# Patient Record
Sex: Male | Born: 1955 | Race: Black or African American | Hispanic: No | Marital: Married | Smoking: Current every day smoker
Health system: Southern US, Community
[De-identification: ages and names within clinical notes are randomized; demographics above are authoritative.]

## PROBLEM LIST (undated history)

## (undated) DIAGNOSIS — I10 Essential (primary) hypertension: Secondary | ICD-10-CM

## (undated) DIAGNOSIS — L84 Corns and callosities: Secondary | ICD-10-CM

## (undated) DIAGNOSIS — E785 Hyperlipidemia, unspecified: Secondary | ICD-10-CM

## (undated) DIAGNOSIS — K219 Gastro-esophageal reflux disease without esophagitis: Secondary | ICD-10-CM

## (undated) DIAGNOSIS — A6000 Herpesviral infection of urogenital system, unspecified: Secondary | ICD-10-CM

## (undated) DIAGNOSIS — I2510 Atherosclerotic heart disease of native coronary artery without angina pectoris: Secondary | ICD-10-CM

## (undated) DIAGNOSIS — N529 Male erectile dysfunction, unspecified: Secondary | ICD-10-CM

## (undated) HISTORY — DX: Gastro-esophageal reflux disease without esophagitis: K21.9

## (undated) HISTORY — DX: Corns and callosities: L84

## (undated) HISTORY — DX: Essential (primary) hypertension: I10

## (undated) HISTORY — PX: CORONARY ANGIOPLASTY WITH STENT PLACEMENT: SHX49

## (undated) HISTORY — DX: Male erectile dysfunction, unspecified: N52.9

## (undated) HISTORY — DX: Herpesviral infection of urogenital system, unspecified: A60.00

## (undated) HISTORY — DX: Hyperlipidemia, unspecified: E78.5

---

## 2001-08-09 DIAGNOSIS — E785 Hyperlipidemia, unspecified: Secondary | ICD-10-CM

## 2001-08-09 HISTORY — DX: Hyperlipidemia, unspecified: E78.5

## 2003-06-11 ENCOUNTER — Emergency Department (HOSPITAL_COMMUNITY): Admission: AD | Admit: 2003-06-11 | Discharge: 2003-06-11 | Payer: Self-pay | Admitting: Emergency Medicine

## 2004-02-16 ENCOUNTER — Ambulatory Visit (HOSPITAL_COMMUNITY): Admission: RE | Admit: 2004-02-16 | Discharge: 2004-02-16 | Payer: Self-pay | Admitting: Gastroenterology

## 2004-06-24 DIAGNOSIS — A6000 Herpesviral infection of urogenital system, unspecified: Secondary | ICD-10-CM

## 2004-06-24 HISTORY — DX: Herpesviral infection of urogenital system, unspecified: A60.00

## 2008-12-06 ENCOUNTER — Encounter: Payer: Self-pay | Admitting: Emergency Medicine

## 2008-12-06 ENCOUNTER — Ambulatory Visit: Payer: Self-pay | Admitting: Diagnostic Radiology

## 2008-12-07 ENCOUNTER — Ambulatory Visit: Payer: Self-pay | Admitting: Cardiology

## 2008-12-07 ENCOUNTER — Observation Stay (HOSPITAL_COMMUNITY): Admission: EM | Admit: 2008-12-07 | Discharge: 2008-12-09 | Payer: Self-pay | Admitting: Internal Medicine

## 2008-12-08 ENCOUNTER — Encounter: Payer: Self-pay | Admitting: Cardiology

## 2010-07-02 LAB — DIFFERENTIAL
Basophils Absolute: 0.1 10*3/uL (ref 0.0–0.1)
Eosinophils Relative: 4 % (ref 0–5)
Lymphocytes Relative: 33 % (ref 12–46)
Lymphs Abs: 2.6 10*3/uL (ref 0.7–4.0)
Monocytes Absolute: 0.6 10*3/uL (ref 0.1–1.0)
Monocytes Relative: 8 % (ref 3–12)
Neutrophils Relative %: 55 % (ref 43–77)

## 2010-07-02 LAB — HEPATIC FUNCTION PANEL
ALT: 21 U/L (ref 0–53)
ALT: 21 U/L (ref 0–53)
AST: 28 U/L (ref 0–37)
Albumin: 3.7 g/dL (ref 3.5–5.2)
Alkaline Phosphatase: 60 U/L (ref 39–117)
Bilirubin, Direct: <0.1 mg/dL (ref 0.0–0.3)
Total Bilirubin: 0.5 mg/dL (ref 0.3–1.2)
Total Protein: 7 g/dL (ref 6.0–8.3)

## 2010-07-02 LAB — BASIC METABOLIC PANEL
BUN: 10 mg/dL (ref 6–23)
BUN: 9 mg/dL (ref 6–23)
CO2: 25 mEq/L (ref 19–32)
Calcium: 9 mg/dL (ref 8.4–10.5)
Chloride: 102 mEq/L (ref 96–112)
Creatinine, Ser: 0.94 mg/dL (ref 0.4–1.5)
GFR calc Af Amer: >60 mL/min (ref >60)
GFR calc non Af Amer: >60 mL/min (ref >60)
GFR calc non Af Amer: >60 mL/min (ref >60)
Glucose, Bld: 93 mg/dL (ref 70–99)
Potassium: 3.9 mEq/L (ref 3.5–5.1)
Sodium: 138 mEq/L (ref 135–145)

## 2010-07-02 LAB — CBC
HCT: 44.2 % (ref 39.0–52.0)
Hemoglobin: 14.8 g/dL (ref 13.0–17.0)
MCHC: 33.5 g/dL (ref 30.0–36.0)
MCV: 90.1 fL (ref 78.0–100.0)
Platelets: 166 10*3/uL (ref 150–400)
RBC: 4.87 MIL/uL (ref 4.22–5.81)
RBC: 4.92 MIL/uL (ref 4.22–5.81)
RDW: 13.9 % (ref 11.5–15.5)
WBC: 12.4 10*3/uL — ABNORMAL HIGH (ref 4.0–10.5)

## 2010-07-02 LAB — COMPREHENSIVE METABOLIC PANEL
ALT: 12 U/L (ref 0–53)
Alkaline Phosphatase: 67 U/L (ref 39–117)
BUN: 14 mg/dL (ref 6–23)
CO2: 25 mEq/L (ref 19–32)
Calcium: 9.6 mg/dL (ref 8.4–10.5)
GFR calc non Af Amer: >60 mL/min (ref >60)
Glucose, Bld: 113 mg/dL — ABNORMAL HIGH (ref 70–99)
Potassium: 4.5 mEq/L (ref 3.5–5.1)
Sodium: 140 mEq/L (ref 135–145)

## 2010-07-02 LAB — CARDIAC PANEL(CRET KIN+CKTOT+MB+TROPI)
CK, MB: 1.6 ng/mL (ref 0.3–4.0)
CK, MB: 2.6 ng/mL (ref 0.3–4.0)
CK, MB: 2.8 ng/mL (ref 0.3–4.0)
Relative Index: 1.1 (ref 0.0–2.5)
Relative Index: 1.3 (ref 0.0–2.5)
Relative Index: 1.4 (ref 0.0–2.5)
Total CK: 143 U/L (ref 7–232)
Total CK: 164 U/L (ref 7–232)
Troponin I: 0.01 ng/mL (ref 0.00–0.06)
Troponin I: 0.01 ng/mL (ref 0.00–0.06)
Troponin I: 0.01 ng/mL (ref 0.00–0.06)

## 2010-07-02 LAB — AMYLASE: Amylase: 51 U/L (ref 27–131)

## 2010-07-02 LAB — POCT CARDIAC MARKERS
CKMB, poc: 1.4 ng/mL (ref 1.0–8.0)
CKMB, poc: 2.9 ng/mL (ref 1.0–8.0)
Myoglobin, poc: 49.7 ng/mL (ref 12–200)
Myoglobin, poc: 61.8 ng/mL (ref 12–200)
Troponin i, poc: <0.05 ng/mL (ref 0.00–0.09)
Troponin i, poc: <0.05 ng/mL (ref 0.00–0.09)

## 2010-07-02 LAB — PROTIME-INR
INR: 0.9 (ref 0.00–1.49)
Prothrombin Time: 12.5 seconds (ref 11.6–15.2)

## 2010-07-23 DIAGNOSIS — L84 Corns and callosities: Secondary | ICD-10-CM

## 2010-07-23 HISTORY — DX: Corns and callosities: L84

## 2010-08-13 NOTE — Op Note (Signed)
NAME:  Alejandro Parker, Alejandro Parker NO.:  [id number]   MEDICAL RECORD NO.:  [id number]          PATIENT TYPE:  AMB   LOCATION:  ENDO                         FACILITY:  [facility]   PHYSICIAN:  Aydon L. Malon Kindle., M.D.DATE OF BIRTH:  February 11, 1956   DATE OF PROCEDURE:  02/16/2004  DATE OF DISCHARGE:                                 OPERATIVE REPORT   PROCEDURE:  Colonoscopy.   MEDICATIONS:  1.  Fentanyl 75 mcg.  2.  Versed 6 mg IV.   SCOPE:  Olympus pediatric adjustable colonoscope.   INDICATION:  Rectal bleeding.   DESCRIPTION OF PROCEDURE:  The procedure had been explained to the patient  and consent obtained.  The patient in left lateral decubitus position, the  Olympus pediatric adjustable scope was inserted and advanced.  We reached  the cecum without difficulty.  The ileocecal valve and appendiceal orifice  seen.  The scope was withdrawn, and the cecum, ascending colon, hepatic  flexure, transverse colon, descending, and sigmoid colon were seen well.  No  polyps or other lesions were seen.  No diverticulosis was seen.  Rectum, the  patient was seen to have internal hemorrhoids; no other abnormalities were  seen.  The scope was withdrawn.  The patient tolerated the procedure well.   ASSESSMENT:  Rectal bleeding, probably internal hemorrhoids.  578.1.   PLAN:  We will give a hemorrhoid instruction sheet, recommend a high fiber  diet, and will see back as needed.      JLE/MEDQ  D:  02/16/2004  T:  02/16/2004  Job:  [id number]

## 2010-09-09 ENCOUNTER — Telehealth: Payer: Self-pay | Admitting: Pulmonary Disease

## 2010-09-09 NOTE — Telephone Encounter (Signed)
Error

## 2010-09-20 ENCOUNTER — Encounter: Payer: Self-pay | Admitting: Pulmonary Disease

## 2010-09-21 ENCOUNTER — Institutional Professional Consult (permissible substitution): Payer: Self-pay | Admitting: Pulmonary Disease

## 2010-10-15 ENCOUNTER — Ambulatory Visit (INDEPENDENT_AMBULATORY_CARE_PROVIDER_SITE_OTHER): Payer: BC Managed Care – PPO | Admitting: Pulmonary Disease

## 2010-10-15 ENCOUNTER — Encounter: Payer: Self-pay | Admitting: Pulmonary Disease

## 2010-10-15 VITALS — BP 142/78 | HR 52 | Temp 98.1°F | Ht 74.5 in | Wt 256.8 lb

## 2010-10-15 DIAGNOSIS — R0789 Other chest pain: Secondary | ICD-10-CM

## 2010-10-15 DIAGNOSIS — R0600 Dyspnea, unspecified: Secondary | ICD-10-CM

## 2010-10-15 DIAGNOSIS — R0609 Other forms of dyspnea: Secondary | ICD-10-CM

## 2010-10-15 MED ORDER — ALBUTEROL SULFATE HFA 108 (90 BASE) MCG/ACT IN AERS: 2.0000 | INHALATION_SPRAY | Freq: Four times a day (QID) | RESPIRATORY_TRACT | Status: DC | PRN

## 2010-10-15 NOTE — Assessment & Plan Note (Signed)
The pt has a chest pressure that is atypical for both angina and obstructive disease, but I think he needs to be evaluated for both.  He has calcifications in his LAD on chest ct, and is scheduled for cath end of month.  He has a h/o cigar smoking, but not cigs.  I think he needs full pfts to evaluate for airways disease/airtrapping, as well as DLCO.  I also need to see his ct chest, and I have asked him to bring disk to next visit.

## 2010-10-15 NOTE — Patient Instructions (Signed)
Will schedule for breathing tests, and arrange for followup with me on same day to discuss. Please bring disk of your xray to next visit.

## 2010-10-15 NOTE — Progress Notes (Signed)
  Subjective:    Patient ID: Alejandro Parker, male    DOB: [birth year], 55 y.o.   MRN: [id number]  HPI The pt is a 55y/o male who I have been asked to see for atypical chest discomfort.  He has a 10mos h/o chest pressure whenever he starts exertional activities.  It usually gets better whenever he slows down, then he is able to go back to heavier exertional activity without the discomfort recurring.  His symptoms are variable, with on particular pattern on when it occurs.  He is unsure if he is sob during this time.  He describes the sensation as if "someone is pumping up a balloon in my chest".  He had recent stress test 3mos ago per pt that was negative, but is scheduled for cath next week.  He has had a ct chest that is not available for my review, but the report shows no significant pulmonary finding.  The pt denies any significant cough or congestion, but does continue to smoke cigars.   He has no h/o asthma.   Review of Systems  Constitutional: Positive for unexpected weight change. Negative for fever.  HENT: Positive for sneezing. Negative for ear pain, nosebleeds, congestion, sore throat, rhinorrhea, trouble swallowing, dental problem, postnasal drip and sinus pressure.   Eyes: Negative for redness and itching.  Respiratory: Positive for cough and shortness of breath. Negative for chest tightness and wheezing.   Cardiovascular: Positive for chest pain, palpitations and leg swelling.  Gastrointestinal: Negative for nausea and vomiting.  Genitourinary: Negative for dysuria.  Musculoskeletal: Negative for joint swelling.  Skin: Negative for rash.  Neurological: Negative for headaches.  Hematological: Does not bruise/bleed easily.  Psychiatric/Behavioral: Negative for dysphoric mood. The patient is not nervous/anxious.        Objective:   Physical Exam Constitutional:  Well developed, no acute distress  HENT:  Nares patent without discharge  Oropharynx without exudate, palate and uvula  are elongated  Eyes:  Perrla, eomi, no scleral icterus  Neck:  No JVD, no TMG  Cardiovascular:  Normal rate, regular rhythm, no rubs or gallops.  No murmurs        Intact distal pulses  Pulmonary :  Normal breath sounds, no stridor or respiratory distress   No rales, rhonchi, or wheezing  Abdominal:  Soft, nondistended, bowel sounds present.  No tenderness noted.   Musculoskeletal:  1+ lower extremity edema noted.  Lymph Nodes:  No cervical lymphadenopathy noted  Skin:  No cyanosis noted  Neurologic:  Alert, appropriate, moves all 4 extremities without obvious deficit.         Assessment & Plan:

## 2010-10-22 ENCOUNTER — Ambulatory Visit (HOSPITAL_COMMUNITY)
Admission: RE | Admit: 2010-10-22 | Discharge: 2010-10-23 | Disposition: A | Discharge status: Home / Self Care | Payer: BC Managed Care – PPO | Source: Ambulatory Visit | Attending: Cardiology | Admitting: Cardiology

## 2010-10-22 DIAGNOSIS — Z79899 Other long term (current) drug therapy: Secondary | ICD-10-CM | POA: Insufficient documentation

## 2010-10-22 DIAGNOSIS — I10 Essential (primary) hypertension: Secondary | ICD-10-CM | POA: Insufficient documentation

## 2010-10-22 DIAGNOSIS — E785 Hyperlipidemia, unspecified: Secondary | ICD-10-CM | POA: Insufficient documentation

## 2010-10-22 DIAGNOSIS — Z7982 Long term (current) use of aspirin: Secondary | ICD-10-CM | POA: Insufficient documentation

## 2010-10-22 DIAGNOSIS — I2510 Atherosclerotic heart disease of native coronary artery without angina pectoris: Secondary | ICD-10-CM | POA: Insufficient documentation

## 2010-10-22 DIAGNOSIS — F17200 Nicotine dependence, unspecified, uncomplicated: Secondary | ICD-10-CM | POA: Insufficient documentation

## 2010-10-22 DIAGNOSIS — Z01812 Encounter for preprocedural laboratory examination: Secondary | ICD-10-CM | POA: Insufficient documentation

## 2010-10-22 HISTORY — PX: CORONARY ANGIOPLASTY WITH STENT PLACEMENT: SHX49

## 2010-10-22 LAB — POCT ACTIVATED CLOTTING TIME: Activated Clotting Time: 512 seconds

## 2010-10-23 LAB — BASIC METABOLIC PANEL
BUN: 11 mg/dL (ref 6–23)
Creatinine, Ser: 0.93 mg/dL (ref 0.50–1.35)
GFR calc Af Amer: >60 mL/min (ref >60)
GFR calc non Af Amer: >60 mL/min (ref >60)
Potassium: 3.7 mEq/L (ref 3.5–5.1)

## 2010-10-23 LAB — CBC
HCT: 40.6 % (ref 39.0–52.0)
MCHC: 33.7 g/dL (ref 30.0–36.0)
MCV: 86.2 fL (ref 78.0–100.0)
RDW: 13.9 % (ref 11.5–15.5)

## 2010-11-05 ENCOUNTER — Encounter: Payer: Self-pay | Admitting: Pulmonary Disease

## 2010-11-05 ENCOUNTER — Encounter (HOSPITAL_COMMUNITY): Payer: BC Managed Care – PPO

## 2010-11-05 ENCOUNTER — Ambulatory Visit (HOSPITAL_COMMUNITY)
Admission: RE | Admit: 2010-11-05 | Discharge: 2010-11-05 | Disposition: A | Discharge status: Home / Self Care | Payer: BC Managed Care – PPO | Source: Ambulatory Visit | Attending: Pulmonary Disease | Admitting: Pulmonary Disease

## 2010-11-05 ENCOUNTER — Ambulatory Visit (INDEPENDENT_AMBULATORY_CARE_PROVIDER_SITE_OTHER): Payer: BC Managed Care – PPO | Admitting: Pulmonary Disease

## 2010-11-05 VITALS — BP 112/64 | HR 80 | Temp 98.0°F | Ht 74.5 in | Wt 256.0 lb

## 2010-11-05 DIAGNOSIS — R0609 Other forms of dyspnea: Secondary | ICD-10-CM | POA: Insufficient documentation

## 2010-11-05 DIAGNOSIS — R0989 Other specified symptoms and signs involving the circulatory and respiratory systems: Secondary | ICD-10-CM | POA: Insufficient documentation

## 2010-11-05 DIAGNOSIS — R0789 Other chest pain: Secondary | ICD-10-CM

## 2010-11-05 NOTE — Progress Notes (Signed)
  Subjective:    Patient ID: Alejandro Parker, male    DOB: Feb 23, 1956, 55 y.o.   MRN: [id number]  HPI Patient comes in today for followup of his recent CT and PFTs, done as part of a workup for chest tightness and dyspnea.  His CT chest was totally normal, as were his PFTs.  Since the last visit, the patient has underwent cardiac catheterization, and tells me that he had coronary disease and has had a stent placement.  Records are not available.  Since that time, the patient has had resolution of his chest symptoms.   Review of Systems  Constitutional: Negative for fever and unexpected weight change.  HENT: Negative for ear pain, nosebleeds, congestion, sore throat, rhinorrhea, sneezing, trouble swallowing, dental problem, postnasal drip and sinus pressure.   Eyes: Negative for redness and itching.  Respiratory: Negative for cough, chest tightness, shortness of breath and wheezing.   Cardiovascular: Negative for palpitations and leg swelling.  Gastrointestinal: Negative for nausea and vomiting.  Genitourinary: Negative for dysuria.  Musculoskeletal: Negative for joint swelling.  Skin: Negative for rash.  Neurological: Negative for headaches.  Hematological: Does not bruise/bleed easily.  Psychiatric/Behavioral: Negative for dysphoric mood. The patient is not nervous/anxious.        Objective:   Physical Exam Wd male in nad No purulence or discharge noted from nares. Chest clear LE without edema or cyanosis Alert and oriented, moves all 4        Assessment & Plan:

## 2010-11-05 NOTE — Patient Instructions (Signed)
Your ct chest and pfts are normal.  No further f/u required.

## 2010-11-05 NOTE — Assessment & Plan Note (Signed)
The pt has a normal ct chest and normal PFT's.  His chest pressure has improved with stenting of LAD.  I have asked him to work hard on the lifestyle modifications recommended by cards, and to followup with me as needed.

## 2010-11-07 ENCOUNTER — Encounter: Payer: Self-pay | Admitting: Pulmonary Disease

## 2010-11-09 ENCOUNTER — Encounter: Payer: Self-pay | Admitting: Pulmonary Disease

## 2010-11-10 NOTE — Cardiovascular Report (Signed)
NAME:  Alejandro Parker, Alejandro Parker NO.:  [id number]  MEDICAL RECORD NO.:  [id number]  LOCATION:  [facility]                         FACILITY:  [facility]  PHYSICIAN:  Pamella Pert, MD DATE OF BIRTH:  01/01/1956  DATE OF PROCEDURE:  10/22/2010 DATE OF DISCHARGE:                           CARDIAC CATHETERIZATION   PROCEDURES PERFORMED: 1. Left ventriculography. 2. Selective right and left coronary arteriography. 3. Percutaneous transluminal coronary angioplasty and stenting of the     mid left anterior descending with implantation of a 3.5 x 28 mm     PROMUS drug-eluting stent.  INDICATIONS:  Alejandro Parker is a 54-year gentleman who has history of prior tobacco use and again has recently started to smoke, had quit about 3 weeks ago.  He also has history of hypertension and hyperlipidemia.  He was referred to me for evaluation of chest discomfort which was suggestive of angina pectoris.  He has had 2 negative stress tests in the past.  Hence, he is brought to the Cardiac Catheterization Lab to evaluate his coronary anatomy.  HEMODYNAMIC DATA:  The left ventricular pressure was 110/1 with end- diastolic pressure of 23 mmHg.  Aortic pressure was 116/71 with a mean of 89 mmHg.  There was no pressure gradient across the aortic valve.  ANGIOGRAPHIC DATA: 1. Left ventricle.  Left ventricular systolic function was normal with     ejection fraction of 60%.  There was no significant mitral     regurgitation. 2. Right coronary artery.  Right coronary artery is codominant with     circumflex coronary artery.  The proximal segment has a 30%     stenosis.  It bifurcates early into RV branch and the moderate-     sized PD branch.  Otherwise, it is smooth and normal. 3. Left main coronary artery.  Left main coronary artery is large     vessel, it splits smooth and normal. 4. LAD.  LAD is a large caliber vessel giving origin to a large     diagonal-1 and a small diagonal-2.  The ostium of the  LAD has an     eccentric 40-50% stenosis.  At the origin of the diagonal-2, there     is a large septal perforator-2.  At this point, there is a high-     grade 90% long segment stenosis in the LAD.  Mid-to-distal LAD has     a 40-50% focal ring-like stenoses. 5. Circumflex coronary artery.  The circumflex coronary artery is     codominant with right coronary artery.  This is a large caliber     vessel.  It has a small obtuse marginal and a large obtuse marginal-     2.  Distally, it continues as PD branch.  The obtuse marginal-2 has     a diffuse 30-40% stenosis. 6. Ramus intermediate.  Ramus intermedius is a large caliber vessel     which is smooth and normal.  INTERVENTION DATA:  Successful PTCA and stenting of the mid LAD with implantation of a 3.5 x 28 mm PROMUS drug-eluting stent.  This stent was deployed at peak of 12 atmospheric pressure for 40 seconds.  Stenosis was reduced  from 90% to 20% followed by postdilatation with a 3.75 x 20 mm [place] TREK at 18 atmospheric pressure peak.  Overall stenosis reduction was from 90% to 0% with brisk TIMI 3 to TIMI 3 flow maintained at the end of the procedure.  There was about 20% to 30% side branch compromise of a small diagonal which was left alone.  RECOMMENDATIONS:  The patient needs aggressive risk modification, again tobacco use and counseling for cessation of the same is indicated.  He will be discharged in the morning if he remains stable.  A total of 175-180 mL of contrast was utilized for diagnostic and intervention procedure.  TECHNIQUE OF PROCEDURE:  Under sterile precautions using a 6-French right radial access, 6-French TIG #4 catheter was advanced into ascending aorta.  Then, the left ventriculography was performed both in LAO and RAO projection.  Catheter pulled in the ascending aorta.  Left main coronary artery was selectively engaged and angiography was performed.  Then, the right coronary was selectively engaged  and angiography was performed.  Then, the catheter was pulled out of body over a safety J-wire exchange length.  TECHNIQUE OF INTERVENTION:  Using Angiomax and anticoagulation, I utilized IKARI left 3.5 guide catheter to engage left main coronary artery.  Using Stabilizer guidewire, I was able to cross through the LAD and the lesion length was carefully measured.  This was followed by predilatation with a 3 x 20 mm Sprinter Legend balloon at a peak of 12 atmospheric pressure.  The inflations were performed between 10 and 30 seconds.  Having performed this, I stented this with a 3.5 x 28 mm PROMUS Element Plus drug-eluting stent at 12 atmospheric pressure for 40 seconds followed by postdilatation with [place] TREK with a 3.75 x 20 mm balloon keeping the balloon within the stent starts at 14 and 18 atmospheric pressure for 30 and 37 seconds followed by intracoronary nitroglycerin administration angiography was performed.  Excellent results were noted.  The ostial LAD stenosis which appeared to be intermediate was evaluated in multiple views, hence was felt to be insignificant.  Hence, this lesion was left alone.  The patient tolerated the procedure well.  No immediate complications.     Pamella Pert, MD     JRG/MEDQ  D:  10/22/2010  T:  10/22/2010  Job:  [id number]  cc:   Fleet Contras, M.D.  Electronically Signed by Yates Decamp MD on 11/10/2010 06:47:26 PM

## 2010-11-11 NOTE — Discharge Summary (Signed)
NAMEDERRIC, DEALMEIDA NO.:  [id number]  MEDICAL RECORD NO.:  [id number]  LOCATION:  6524                         FACILITY:  [facility]  PHYSICIAN:  Eduardo Osier. Sharyn Lull, M.D. DATE OF BIRTH:  Feb 14, 1956  DATE OF ADMISSION:  10/22/2010 DATE OF DISCHARGE:                              DISCHARGE SUMMARY   ADMITTING DIAGNOSES: 1. Accelerated angina. 2. Hypertension. 3. Hypercholesteremia. 4. Tobacco abuse.  DISCHARGE DIAGNOSES: 1. Accelerated angina, status post left cath and percutaneous     transluminal coronary angioplasty stenting to proximal left     anterior descending. 2. Hypertension. 3. Hypercholesteremia. 4. Tobacco abuse.  DISCHARGE HOME MEDICATIONS: 1. Enteric-coated aspirin 81 mg 1 tablet daily. 2. Brilinta 90 mg 1 tablet twice daily. 3. Losartan 100 mg 1 tablet daily. 4. Benzonatate 100-200 mg 3 times daily as before. 5. Crestor 40 mg 1 tablet daily. 6. Nitrostat 0.4 mg sublingual use as directed.  DIET:  Low-salt, low-cholesterol.  The patient has been advised to stop smoking to which he agrees lifestyle modification have been discussed at length and he is going to change his diet.  The patient is scheduled for phase II cardiac rehab as outpatient.  CONDITION AT DISCHARGE:  Stable.  Follow up with Dr. Jacinto Halim next week.  BRIEF HISTORY AND HOSPITAL COURSE:  Mr. Uvaldo Rising is a 55 year old black male with past medical history significant for hypertension, hypercholesteremia, tobacco abuse, positive family history of coronary artery disease, was admitted as outpatient for elective left cath, possible PTCA stenting.  The patient complained of retrosternal chest pain off and on for last 1 year associated with exertion, localized, lasting few minutes, released with rest.  The patient denies any nausea, vomiting, or diaphoresis.  Denies any palpitation, lightheadedness or syncope.  The patient had approximately 4 months ago stress test which was negative.   Due to typical anginal chest pain, multiple risk factors, the patient subsequently consented for left cath and possible PCI.  MEDICATION AT HOME:  He was on aspirin, Losartan, simvastatin, omeprazole.  FAMILY HISTORY:  Father had MI at young age, died at the age of 27.  He had lung cancer.  He also had stents and pacemaker.  Mother died at age of 54 due to breast cancer.  SOCIAL HISTORY:  He is married.  Drinks 2-3 drinks per week.  Recently, he started smoking again.  PHYSICAL EXAMINATION:  GENERAL:  He is alert, awake, and oriented x3, in no acute distress. HEENT:  Conjunctiva was pink. NECK:  Supple, no JVD. LUNGS:  Clear to auscultation without rhonchi or rales. CARDIOVASCULAR:  S1 and S2 was normal.  There was no murmur or rub. ABDOMEN:  Soft.  Bowel sounds present, nontender. EXTREMITIES:  There is no clubbing, cyanosis or edema.  LABORATORY DATA:  Postprocedure labs today, hemoglobin is 13.7, hematocrit 40.6, white count of 6.7.  Sodium was 140, potassium 3.7, BUN 11, creatinine 0.93.  BRIEF HOSPITAL COURSE:  The patient was admitted and underwent a left cardiac cath with selective left and right coronary angiography and PTCA stenting via right radial approach by Dr. Jacinto Halim.  The patient tolerated procedure well.  There are no complications.  Postprocedure, the patient did  not have any episodes of chest pain.  His right forearm radial cath site is dry with no evidence of hematoma.  The patient denies any tingling or numbness in the arm.  The patient has been ambulating in the room without any problems.  The patient will be discharged home on above medications and will be followed by Dr. Jacinto Halim next week.  The patient will be scheduled for phase II cardiac rehab as outpatient.     Eduardo Osier. Sharyn Lull, M.D.     MNH/MEDQ  D:  10/23/2010  T:  10/23/2010  Job:  [id number]  Electronically Signed by Rinaldo Cloud M.D. on 11/11/2010 08:06:07 PM

## 2010-11-23 ENCOUNTER — Ambulatory Visit (HOSPITAL_COMMUNITY)
Admission: RE | Admit: 2010-11-23 | Discharge: 2010-11-23 | Disposition: A | Discharge status: Home / Self Care | Payer: BC Managed Care – PPO | Source: Ambulatory Visit | Attending: Cardiology | Admitting: Cardiology

## 2010-11-23 DIAGNOSIS — I209 Angina pectoris, unspecified: Secondary | ICD-10-CM | POA: Insufficient documentation

## 2010-11-23 DIAGNOSIS — I2510 Atherosclerotic heart disease of native coronary artery without angina pectoris: Secondary | ICD-10-CM | POA: Insufficient documentation

## 2010-11-23 DIAGNOSIS — Z9861 Coronary angioplasty status: Secondary | ICD-10-CM | POA: Insufficient documentation

## 2010-12-07 NOTE — Cardiovascular Report (Signed)
NAME:  Alejandro Parker, Alejandro Parker NO.:  [id number]  MEDICAL RECORD NO.:  [id number]  LOCATION:  [facility]                         FACILITY:  [facility]  PHYSICIAN:  Pamella Pert, MD DATE OF BIRTH:  [birth year]  DATE OF PROCEDURE:  11/23/2010 DATE OF DISCHARGE:                           CARDIAC CATHETERIZATION   PROCEDURE PERFORMED:  Coronary arteriography including selective left and right coronary therapy.  INDICATIONS:  Alejandro Parker is a 55 year old gentleman with history of known coronary artery disease.  He had undergone angioplasty and stenting to his proximal LAD on October 22, 2010, with implantation of a 3.5 x 28-mm Promus stent in the proximal LAD.  At that time, he was found to have a very large diagonal 1 which had a 70-80% stent-jailed stenosis with brisk flow, but was left alone.  He had been doing well, but had developed exertional angina pectoris.  He underwent a treadmill stress testing, in which he again developed chest pain with significant ST changes.  Given this, he is now brought back to the angiography suite to reevaluate for any mechanical complications for stenting and also to reevaluate the diagonal stenosis if this is the culprit for his angina.  HEMODYNAMIC DATA:  The aortic pressure was 99/54 with a mean of 81 milliseconds.  ANGIOGRAPHIC DATA:  Left ventriculography.  Left ventriculography was not performed.  Left main coronary artery:  Left main coronary artery is large-caliber vessel.  Smooth and normal.  Circumflex coronary artery:  Circumflex coronary artery is codominant with right coronary artery.  It is smooth and normal.  The obtuse marginal branch has a 10-20% mild luminal irregularity.  Ramus intermedius.  Ramus intermedius is a very large-caliber vessel, it is smooth and normal.  LAD.  LAD is a large-caliber vessel giving origin to a large diagonal 1. The previously placed stent in the proximal LAD is widely patent.   The stent-jailed diagonal ostium actually looks better with only maybe a 20% stenosis with brisk flow.  There was no evidence of any edge dissection or spasm in the coronary arteries.  Right coronary artery.  Right coronary artery is codominant with circumflex coronary artery.  Smooth and normal.  IMPRESSION:  I suspect chest pain could be related to coronary spasm. Widely patent stent.  RECOMMENDATIONS:  The patient will be discharged home today.  I will consider utilization of a calcium channel blocker if his chest pain were to recur.  Continued aggressive risk modification is indicated with aggressive reduction in his lipids.  He is on Zocor and Crestor.  I will stop his Zocor and continue Crestor.  At home, he is also on carvedilol at 6.25 mg p.o. b.i.d., which he will continue.  TECHNIQUE OF THE PROCEDURE:  Under usual sterile precautions using a 6- French right radial access, a 5-French TIG4 catheter was advanced into the ascending aorta and then selective right and left coronary arteriography was performed.  The catheter then pulled out of body over an exchange length J-wire.  The patient tolerated the procedure well. Hemostasis was obtained by applying TR band.     Pamella Pert, MD     JRG/MEDQ  D:  11/23/2010  T:  11/23/2010  Job:  [id number]  cc:   Fleet Contras, M.D.  Electronically Signed by Yates Decamp MD on 12/07/2010 11:14:14 AM

## 2011-11-15 ENCOUNTER — Other Ambulatory Visit: Payer: Self-pay | Admitting: Cardiology

## 2012-11-12 ENCOUNTER — Emergency Department (HOSPITAL_COMMUNITY)
Admission: EM | Admit: 2012-11-12 | Discharge: 2012-11-12 | Disposition: A | Discharge status: Home / Self Care | Payer: BC Managed Care – PPO | Attending: Emergency Medicine | Admitting: Emergency Medicine

## 2012-11-12 ENCOUNTER — Encounter (HOSPITAL_COMMUNITY): Payer: Self-pay | Admitting: *Deleted

## 2012-11-12 ENCOUNTER — Emergency Department (HOSPITAL_COMMUNITY): Payer: BC Managed Care – PPO

## 2012-11-12 DIAGNOSIS — R079 Chest pain, unspecified: Secondary | ICD-10-CM | POA: Insufficient documentation

## 2012-11-12 DIAGNOSIS — F17200 Nicotine dependence, unspecified, uncomplicated: Secondary | ICD-10-CM | POA: Insufficient documentation

## 2012-11-12 DIAGNOSIS — Z79899 Other long term (current) drug therapy: Secondary | ICD-10-CM | POA: Insufficient documentation

## 2012-11-12 DIAGNOSIS — I10 Essential (primary) hypertension: Secondary | ICD-10-CM

## 2012-11-12 DIAGNOSIS — Z9861 Coronary angioplasty status: Secondary | ICD-10-CM

## 2012-11-12 DIAGNOSIS — Z7982 Long term (current) use of aspirin: Secondary | ICD-10-CM | POA: Insufficient documentation

## 2012-11-12 DIAGNOSIS — E785 Hyperlipidemia, unspecified: Secondary | ICD-10-CM

## 2012-11-12 DIAGNOSIS — I2510 Atherosclerotic heart disease of native coronary artery without angina pectoris: Secondary | ICD-10-CM

## 2012-11-12 HISTORY — DX: Atherosclerotic heart disease of native coronary artery without angina pectoris: I25.10

## 2012-11-12 LAB — CBC
MCH: 30.1 pg (ref 26.0–34.0)
MCV: 85.4 fL (ref 78.0–100.0)
Platelets: 163 10*3/uL (ref 150–400)
RBC: 4.19 MIL/uL — ABNORMAL LOW (ref 4.22–5.81)
RDW: 15.2 % (ref 11.5–15.5)

## 2012-11-12 LAB — POCT I-STAT TROPONIN I

## 2012-11-12 LAB — BASIC METABOLIC PANEL
Chloride: 106 mEq/L (ref 96–112)
GFR calc Af Amer: >90 mL/min (ref >90)
GFR calc non Af Amer: 80 mL/min — ABNORMAL LOW (ref >90)
Glucose, Bld: 93 mg/dL (ref 70–99)
Potassium: 3.6 mEq/L (ref 3.5–5.1)
Sodium: 140 mEq/L (ref 135–145)

## 2012-11-12 MED ORDER — OMEPRAZOLE 20 MG PO CPDR: 20.0000 mg | DELAYED_RELEASE_CAPSULE | Freq: Every day | ORAL | Status: DC

## 2012-11-12 NOTE — ED Notes (Signed)
Pt states chest pain that started this am at rest and took nitro with some relief.  No shortness of breath or radiation of pain

## 2012-11-12 NOTE — ED Provider Notes (Signed)
CSN: [id number]     Arrival date & time 11/12/12  1334 History     First MD Initiated Contact with Patient 11/12/12 1353     Chief Complaint  Patient presents with  . Chest Pain   (Consider location/radiation/quality/duration/timing/severity/associated sxs/prior Treatment) Patient is a 57 y.o. male presenting with chest pain. The history is provided by the patient.  Chest Pain Associated symptoms: no abdominal pain, no back pain, no headache, no nausea, no numbness, no shortness of breath, not vomiting and no weakness    patient with dull left lower chest pain. Began while at work today. He had not done much physical activity. Usually by nitroglycerin. Has previous coronary artery disease with a stent. No nausea. He states he feels better now. He previously seen Dr. Nadara Eaton. The last time that he used nitroglycerin was a few years ago.  Past Medical History  Diagnosis Date  . Coronary artery disease   . Hypertension    Past Surgical History  Procedure Laterality Date  . Coronary angioplasty with stent placement     No family history on file. History  Substance Use Topics  . Smoking status: Current Every Day Smoker  . Smokeless tobacco: Not on file  . Alcohol Use: Yes     Comment: occ    Review of Systems  Constitutional: Negative for activity change and appetite change.  HENT: Negative for neck stiffness.   Eyes: Negative for pain.  Respiratory: Negative for chest tightness and shortness of breath.   Cardiovascular: Positive for chest pain. Negative for leg swelling.  Gastrointestinal: Negative for nausea, vomiting, abdominal pain and diarrhea.  Genitourinary: Negative for flank pain.  Musculoskeletal: Negative for back pain.  Skin: Negative for rash.  Neurological: Negative for weakness, numbness and headaches.  Psychiatric/Behavioral: Negative for behavioral problems.    Allergies  Aleve; Crestor; and Other  Home Medications   Current Outpatient Rx  Name  Route   Sig  Dispense  Refill  . amLODipine (NORVASC) 5 MG tablet   Oral   Take 5 mg by mouth daily.         Marland Kitchen aspirin 81 MG tablet   Oral   Take 81 mg by mouth daily.         . carvedilol (COREG) 3.125 MG tablet   Oral   Take 3.125 mg by mouth 2 (two) times daily with a meal.         . Cholecalciferol (VITAMIN D PO)   Oral   Take 1 tablet by mouth daily.         . diclofenac (VOLTAREN) 50 MG EC tablet   Oral   Take 50 mg by mouth 2 (two) times daily.         Marland Kitchen ezetimibe-simvastatin (VYTORIN) 10-40 MG per tablet   Oral   Take 1 tablet by mouth at bedtime.         . Multiple Vitamin (ONE-A-DAY MENS PO)   Oral   Take 1 tablet by mouth daily.         . Multiple Vitamin (STRESSTABS PO)   Oral   Take 2 tablets by mouth 2 (two) times daily. 2 tablets every morning & 2 tablets every evening         . olmesartan-hydrochlorothiazide (BENICAR HCT) 40-25 MG per tablet   Oral   Take 1 tablet by mouth every morning.         Marland Kitchen OVER THE COUNTER MEDICATION   Oral   Take 1 tablet  by mouth 2 (two) times daily. ALJ tablets (herbal supplement)          BP 129/63  Pulse 48  Temp(Src) 97.8 F (36.6 C) (Oral)  Resp 17  SpO2 97% Physical Exam  Nursing note and vitals reviewed. Constitutional: He is oriented to person, place, and time. He appears well-developed and well-nourished.  HENT:  Head: Normocephalic and atraumatic.  Eyes: EOM are normal. Pupils are equal, round, and reactive to light.  Neck: Normal range of motion. Neck supple.  Cardiovascular: Normal rate, regular rhythm and normal heart sounds.   No murmur heard. Pulmonary/Chest: Effort normal and breath sounds normal.  Abdominal: Soft. Bowel sounds are normal. He exhibits no distension and no mass. There is no tenderness. There is no rebound and no guarding.  Musculoskeletal: Normal range of motion. He exhibits no edema.  Neurological: He is alert and oriented to person, place, and time. No cranial nerve  deficit.  Skin: Skin is warm and dry.  Psychiatric: He has a normal mood and affect.    ED Course   Procedures (including critical care time)  Labs Reviewed  CBC - Abnormal; Notable for the following:    RBC 4.19 (*)    Hemoglobin 12.6 (*)    HCT 35.8 (*)    All other components within normal limits  BASIC METABOLIC PANEL - Abnormal; Notable for the following:    GFR calc non Af Amer 80 (*)    All other components within normal limits  POCT I-STAT TROPONIN I   Dg Chest 2 View  11/12/2012   *RADIOLOGY REPORT*  Clinical Data: Chest pain and hypertension.  CHEST - 2 VIEW  Comparison: None.  Findings: Two-view chest shows no focal airspace consolidation or pulmonary edema.  No pleural effusion. The cardiopericardial silhouette is within normal limits for size. Imaged bony structures of the thorax are intact.  IMPRESSION: No acute cardiopulmonary process.   Original Report Authenticated By: Kennith Center, M.D.   1. Chest pain     Date: 11/12/2012  Rate: 52  Rhythm: sinus bradycardia   QRS Axis: normal  Intervals: normal  ST/T Wave abnormalities: normal  Conduction Disutrbances: none  Narrative Interpretation: unremarkable    MDM  Patient with chest pain with previous stent. Relief with nitroglycerin. Pain-free now. Will be seen by Dr. Hall Busing R. Rubin Payor, MD 11/12/12 1600

## 2012-11-12 NOTE — H&P (Signed)
Alejandro Parker is an 57 y.o. male.   Chief Complaint: Chest pain HPI: Patient is a 57 year old African American male with history of known coronary artery disease, hyperlipidemia tobacco use disorder who has undergone coronary angioplasty and stent implantation to his proximal and mid LAD on 10/22/2010. He had been doing well and this morning while he was at workplace, developed chest discomfort. This is described as discomfort, felt like he needed for, and he received one sublingual nitroglycerin and his blood pressure was also noted to be high. After he took a nitroglycerin he felt well and had no further chest pain, but he wanted to be checked out. Presently he is in the emergency room and remains essentially asymptomatic. No recurrence of chest pain. No other associated symptoms. He states that the chest pain was not necessarily her the fact and had stents placed, however he states that because chest pain he was worried. He denies any symptoms occurred, nausea, vomiting, no shortness breath, PND or orthopnea. Denies symptoms of TIA or claudication.  Past Medical History  Diagnosis Date  . Coronary artery disease   . Hypertension     Past Surgical History  Procedure Laterality Date  . Coronary angioplasty with stent placement      No family history on file. Social History:  reports that he has been smoking.  He does not have any smokeless tobacco history on file. He reports that  drinks alcohol. He reports that he does not use illicit drugs.  Allergies:  Allergies  Allergen Reactions  . Aleve [Naproxen] Hives  . Crestor [Rosuvastatin] Itching  . Other Rash    Synthetic rubber    Review of Systems - HEENT:Not Present- Blurred Vision. Respiratory:Not Present- Bloody sputum, Difficulty Breathing on Exertion and Wakes up from Sleep Wheezing or Short of Breath. Cardiovascular:Not Present- Edema, Leg Cramps, Palpitations and Paroxysmal Nocturnal Dyspnea. Gastrointestinal:Not Present-  Black, Tarry Stool, Bloody Stool and Heartburn.  Blood pressure 129/63, pulse 48, temperature 97.8 F (36.6 C), temperature source Oral, resp. rate 17, SpO2 97.00%. General: Well built and overweight body habitus who is in no acute distress. Appears stated age. Alert Ox3.   There is no cyanosis. HEENT: normal limits. PERRLA, No JVD.   CARDIAC EXAM: S1, S2 normal, no gallop present. No murmur.   CHEST EXAM: No tenderness of chest wall. LUNGS: Clear to percuss and auscultate.  ABDOMEN: No hepatosplenomegaly. BS normal in all 4 quadrants. Abdomen is non-tender.   EXTREMITY: Full range of movementes in other limbs, No edema. No calf tenderness.  NEUROLOGIC EXAM: Grossly intact without any focal deficits. Alert O x 3.   VASCULAR EXAM: No skin breakdown. Carotids normal. Extremities: Femoral pulse normal. Popliteal pulse normal ; Pedal pulse normal. Results for orders placed during the hospital encounter of 11/12/12 (from the past 48 hour(s))  CBC     Status: Abnormal   Collection Time    11/12/12  1:41 PM      Result Value Range   WBC 5.3  4.0 - 10.5 K/uL   RBC 4.19 (*) 4.22 - 5.81 MIL/uL   Hemoglobin 12.6 (*) 13.0 - 17.0 g/dL   HCT 16.1 (*) 09.6 - 04.5 %   MCV 85.4  78.0 - 100.0 fL   MCH 30.1  26.0 - 34.0 pg   MCHC 35.2  30.0 - 36.0 g/dL   RDW 40.9  81.1 - 91.4 %   Platelets 163  150 - 400 K/uL  BASIC METABOLIC PANEL     Status: Abnormal  Collection Time    11/12/12  1:41 PM      Result Value Range   Sodium 140  135 - 145 mEq/L   Potassium 3.6  3.5 - 5.1 mEq/L   Chloride 106  96 - 112 mEq/L   CO2 25  19 - 32 mEq/L   Glucose, Bld 93  70 - 99 mg/dL   BUN 18  6 - 23 mg/dL   Creatinine, Ser 9.14  0.50 - 1.35 mg/dL   Calcium 9.3  8.4 - 78.2 mg/dL   GFR calc non Af Amer 80 (*) >90 mL/min   GFR calc Af Amer >90  >90 mL/min   Comment: (NOTE)     The eGFR has been calculated using the CKD EPI equation.     This calculation has not been validated in all clinical  situations.     eGFR's persistently <90 mL/min signify possible Chronic Kidney     Disease.  POCT I-STAT TROPONIN I     Status: None   Collection Time    11/12/12  2:12 PM      Result Value Range   Troponin i, poc 0.00  0.00 - 0.08 ng/mL   Comment 3            Comment: Due to the release kinetics of cTnI,     a negative result within the first hours     of the onset of symptoms does not rule out     myocardial infarction with certainty.     If myocardial infarction is still suspected,     repeat the test at appropriate intervals.   Dg Chest 2 View  11/12/2012   *RADIOLOGY REPORT*  Clinical Data: Chest pain and hypertension.  CHEST - 2 VIEW  Comparison: None.  Findings: Two-view chest shows no focal airspace consolidation or pulmonary edema.  No pleural effusion. The cardiopericardial silhouette is within normal limits for size. Imaged bony structures of the thorax are intact.  IMPRESSION: No acute cardiopulmonary process.   Original Report Authenticated By: Kennith Center, M.D.    Labs:   Lab Results  Component Value Date   WBC 5.3 11/12/2012   HGB 12.6* 11/12/2012   HCT 35.8* 11/12/2012   MCV 85.4 11/12/2012   PLT 163 11/12/2012    Recent Labs Lab 11/12/12 1341  NA 140  K 3.6  CL 106  CO2 25  BUN 18  CREATININE 1.02  CALCIUM 9.3  GLUCOSE 93   EKG: 11/12/2012: Independently reviewed and interpreted, sinus bradycardia at a rate of 54 beats a minute, normal intervals, no evidence of ischemia.   Assessment/Plan   1. Chest pain could be angina pectoris, no recurrence of chest pain after one sublingual nitroglycerin. EKG essentially reveals sinus bradycardia but no ischemia. 2. Coronary artery disease, Heart catheterization 10/22/10: Mid LAD 90% to 0% with 3.5x28 mm Promus (Post dilatation with 3.73mm balloon). Repeat heart catheterization done for a abnormal stress test due to chest pain on 11/16/10: Stent widely patent. 3. Hyperlipidemia 4. History of tobacco use  disorder  Recommendation: Patient essentially asymptomatic, but was worried he had an episode of chest pain wanted to be checked out. Initially called our office and as I was not in the office came to the emergency room. Is essentially asymptomatic and is accompanied by his wife the bedside. I will make the patient tablet in the hallway, if there is no recurrence of chest pain he can be discharged home safely. I given clear-cut instruction to the  patient and his wife that he were to have any recurrence of chest pain, he is to call us 24 7, selective admitted directly to the hospital with unstable angina diagnosis. Smoking cessation was again discussed with the patient. Patient probably may benefit from PPI  which would prescribed on discharge from ED.  Pamella Pert, MD 11/12/2012, 4:19 PM [facility] Pager: [phone] Office: [phone] If no answer: Cell:  [phone]

## 2012-12-06 ENCOUNTER — Encounter: Payer: Self-pay | Admitting: Pulmonary Disease

## 2016-05-25 DIAGNOSIS — E7800 Pure hypercholesterolemia, unspecified: Secondary | ICD-10-CM | POA: Diagnosis not present

## 2016-05-25 DIAGNOSIS — F17200 Nicotine dependence, unspecified, uncomplicated: Secondary | ICD-10-CM | POA: Diagnosis not present

## 2016-05-25 DIAGNOSIS — I2510 Atherosclerotic heart disease of native coronary artery without angina pectoris: Secondary | ICD-10-CM | POA: Diagnosis not present

## 2016-05-25 DIAGNOSIS — I10 Essential (primary) hypertension: Secondary | ICD-10-CM | POA: Diagnosis not present

## 2016-08-28 NOTE — H&P (Signed)
OFFICE VISIT NOTES COPIED TO EPIC FOR DOCUMENTATION  . History of Present Illness Alejandro Bouchard FNP-C; 08/24/2016 4:41 PM) Patient words: Last O/V 08/10/2016; Acute visit for CP, SOB. Pt has taken Ntg the last 3 days & it relieves his sx. F/U for Nuc, Echo results.  The patient is a 61 year old male who presents for a Follow-up for Coronary artery disease. African American male with past medical history of CAD. He has undergone prior angioplasty to his LAD. He is now on Atorvastatin and is tolerating this well. He presented for exertional chest pain for the last 3-4 weeks that occurs when walking and is relieved with rest. He is on appropriate medications. He reports chest pain is associated with shortness of breath and diaphoresis. He reports pain does not occur everyday. He states that he has been out of nitroglycerin; therefore, has not taken any. He had called our office a few weeks ago for chest pain. He was found to have abnormal EKG and given his multiple risk factors, he underwent lexiscan nuclear stress testing and echocardiogram.  He called our office today asking to be seen as for the last 3 days he has had chest pain both at rest and with exertion and has had to take nitroglycerin for the last 3 days. He reports that nitroglycerin helps his chest pain within 2-3 minutes.  Since his last office visit, he has stopped smoking, but is now using Vape to help wean off his nicotine. Denies symptoms of claudication or TIA, no PND or orthopnea.   Problem List/Past Medical (Alejandro Parker; 08/24/2016 3:14 PM) Atherosclerosis of native coronary artery of native heart without angina pectoris (I25.10)  Heart catheterization 10/22/10: Mid LAD 90% to 0% with 3.5x28 mm Promus (Post dilatation with 3.53mm balloon). Repeat heart catheterization done for a abnormal stress test due to chest pain on 11/16/10: Stent widely patent. Lexiscan sestamibi stress test 08/12/2016: 1. Resting EKG demonstrates normal  sinus rhythm. Patient initially attempted treadmill exercise stress test in which he was able to exercise for 6:49 minutes and and achieved 8.30 METs, 79% of MPHR, stress test changed to Lexiscan due to chest pain and dyspnea and ST segment changes. With exercise, patient developed 4 mm ST segment depression with T-wave inversion that persisted for 2 minutes into recovery. Stress test was markedly abnormal and positive for myocardial ischemia. There were no EKG chagnes with Lexiscan infusion. 2. SPECT images demonstrate Medium perfusion abnormality of moderate intensity in the basal inferior, mid inferior and apical inferior myocardial wall(s) on the stress images. In addition, there is a small perfusion abnormality of moderate intensity in the apical lateral myocardial wall(s) on the stress images. The inferior wall defect appears to be soft tissue attenuation and the small apical lateral defect a small area of ischemia. Gated SPECT images reveal normal myocardial thickening and wall motion. The left ventricular ejection fraction was calculated or visually estimated to be 69%. This is an intermediate risk study, clinical correlation recommended in view of abnormal EKG at submaximal exercise and associated chest pain. Postsurgical percutaneous transluminal coronary angioplasty status (Z98.61)  Essential hypertension, benign (I10)  Pure hypercholesterolemia (E78.00)  Labwork  08/03/2016: Hemoglobin 12.4, hematocrit 36.5, normal indices, CBC otherwise normal. Creatinine 0.98, potassium 4.0, calcium 10.6, CMP otherwise normal. Cholesterol 130, triglycerides 92, HDL 35, LDL 77. 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 Mild concentric hypertrophy of the left ventricle. Normal global wall motion. Normal diastolic filling pattern. Calculated EF 63%. Left atrial cavity is mild to moderately dilated at 4.4 cm. Mild (Grade I) mitral regurgitation. Mild tricuspid regurgitation. No evidence of pulmonary hypertension.  Allergies 19-Jul-2022 Alejandro Parker; 09-08-16 3:14 PM) Aleve *ANALGESICS - ANTI-INFLAMMATORY*  Hives. Crestor *ANTIHYPERLIPIDEMICS* [11/27/2011 04:06 PM]: Hives. Itching Lipitor *ANTIHYPERLIPIDEMICS*  Severe itching Latex Exam Gloves *MEDICAL DEVICES AND SUPPLIES*   Family History 07/19/2022 Alejandro Parker; 09-08-2016 3:14 PM) Mother  Deceased. at age 78, from Breast Cancer. Father  Deceased. at age 79, from Lung Cancer Siblings  12 Siblings 2 Deceased  Social History 2022/07/19 Alejandro Parker; 2016/09/08 3:22 PM) Current tobacco use  Former smoker. Quit 08/08/2016 Alcohol Use  Occasional alcohol use. 2 days a week (3 drinks) Marital status  Married. Number of Children  3. Living Situation  Lives with spouse.  Past Surgical History 2022/07/19 Alejandro Parker; 09/08/2016 3:14 PM) Heart catheterization 10/22/10: Mid LAD 90% to 0% with 3.5x28 mm Promus (Post dilatation with 3.46mm balloon), ostial LAD 40-50%, and mid to distal LAD 50%. Large D1 ostial 70-80 and small D2 70%. Codominant distal Circumflex 40%. EF normal.  Arthroscopic Knee Surgery - Right [02/20/2015]:  Medication History 2022-07-19 Alejandro Parker; Sep 08, 2016 3:31 PM) Nitrostat (0.4MG  Tab Sublingual, 1 (one) Tab Sub Tab Sublingua Sublingual every 5 minutes as needed for chest pain., Taken starting 08/16/2016) Active. Viagra (100MG  Tablet, 1 Tablet Oral as needed, Taken starting  08/10/2016) Active. (Do not take with Nitroglycerin; PA Approved 07/11/2016-08/10/2017) Carvedilol (3.125MG  Tablet, 1 (one) Tablet Tablet Oral two times daily, Taken starting 08/01/2016) Active. Atorvastatin Calcium (40MG  Tablet, 1 (one) Tablet Tablet Tablet Oral daily, Taken starting 05/15/2016) Active. Valsartan-Hydrochlorothiazide (320-25MG  Tablet, 1 (one) Tablet Tablet Table Oral daily, Taken starting 02/28/2016) Active. AmLODIPine Besylate (5MG  Tablet, 1 (one) Tablet Tablet Tablet Oral daily, Taken starting 02/03/2016) Active. Vitamin D3 (1000UNIT Tablet, Oral daily, Taken starting 08/27/2012) Active. (Per pt he takes 10mcg/BID) Aspirin (81MG  Tablet, 1 Oral daily) Active. Liver Defense (1 Oral two times daily) Active. Ultimate Omega 3 1280mg  (Free Text) (2 daily) Active. Himalaya Stress Relief (Free Text) (4 daily) Active. Omeprazole (20MG  Capsule DR, 1 Oral as needed) Active. ALJ Shelbie Ammons (2 daily for seasonal allergies) Active. Medications Reconciled (verbally with pt; list present)  Diagnostic Studies History 07/19/22 Alejandro Parker; 09-08-2016 3:33 PM) Colonoscopy [01/2015]: Normal. Echocardiogram [08/10/2016]: Left ventricle cavity is normal in size. Mild concentric hypertrophy of the left ventricle. Normal global wall motion. Normal diastolic filling pattern. Calculated EF 63%. Left atrial cavity is mild to moderately dilated at 4.4 cm. Mild (Grade I) mitral regurgitation. Mild tricuspid regurgitation. No evidence of pulmonary hypertension. Nuclear stress test [08/12/2016]: 1. Resting EKG demonstrates normal sinus rhythm. Patient initially attempted treadmill exercise stress test in which he was able to exercise for 6:49 minutes and and achieved 8.30 METs, 79% of MPHR, stress test changed to Lexiscan due to chest pain and dyspnea and ST segment changes. With exercise, patient developed 4 mm ST segment depression with T-wave inversion that persisted for 2 minutes into  recovery. Stress test was markedly abnormal and positive for myocardial ischemia. There were  no EKG chagnes with Lexiscan infusion. 2. SPECT images demonstrate Medium perfusion abnormality of moderate intensity in the basal inferior, mid inferior and apical inferior myocardial wall(s) on the stress images. In addition, there is a small perfusion abnormality of moderate intensity in the apical lateral myocardial wall(s) on the stress images. The inferior wall defect appears to be soft tissue attenuation and the small apical lateral defect a small area of ischemia. Gated SPECT images reveal normal myocardial thickening and wall motion. The left ventricular ejection fraction was calculated or visually estimated to be 69%. This is an intermediate risk study, clinical correlation recommended in view of abnormal EKG at submaximal exercise and associated chest pain.  Health Maintenance History (Alejandro Parker; 08/24/2016 3:14 PM) Has had a nuclear stress test 2010 and treadmill stress test 3/12.  ECG 07/27/11: S. Bradycardia @ 47/min. Normal intervals. Non specific T change. u-wave present. No significanat change from ECG 01/26/11  Heart catheterization 10/22/10: Mid LAD 90% to 0% with 3.5x28 mm Promus (Post dilatation with 3.82mm balloon), Repeat heart catheterization done for a abnormal stress test due to chest pain on 11/16/10: Stent widely patent.  Stress EKG: 11/16/10: Positive for ischemia with chest pain. Chest pain resolved with ST resolution.  Nuclear stress(pharmacologic) test 12/09/08: Diaphragmatic attenuation. No ischemia. EF 55%   Other Problems (Alejandro Parker; 08/24/2016 3:14 PM) Hospitalized in 2010 for chest pain at [facility].  heart cath 11/23/10: Patent prox. LAD stent 3.5x28 Promus stent (7/27/112), D1 30-40% stenosis.  H/O arthroscopy of right knee (Z61.096) [02/20/2015]: 02/20/2015    Review of Systems Alejandro Bouchard, FNP-C; 08/24/2016 4:51 PM) General Present- Feeling well. Not  Present- Tiredness and Unable to Sleep Lying Flat. HEENT Not Present- Blurred Vision. Respiratory Not Present- Bloody sputum, Difficulty Breathing on Exertion and Wakes up from Sleep Wheezing or Short of Breath. Cardiovascular Present- Chest Pain (with exertion) and Difficulty Breathing On Exertion. Not Present- Edema, Leg Cramps, Palpitations and Paroxysmal Nocturnal Dyspnea. Gastrointestinal Not Present- Black, Tarry Stool, Bloody Stool and Heartburn. Musculoskeletal Not Present- Claudication. Neurological Not Present- Focal Neurological Symptoms. Psychiatric Not Present- Personality Changes and Suicidal Ideation. Hematology Not Present- Blood Clots, Easy Bruising and Nose Bleed.  Vitals (Alejandro Alejandro Parker; 08/24/2016 3:28 PM) 08/24/2016 3:17 PM Weight: 234.13 lb Height: 74.5in Body Surface Area: 2.33 m Body Mass Index: 29.66 kg/m  Pulse: 65 (Regular)  P.OX: 99% (Room air) BP: 125/58 (Sitting, Left Arm, Standard)       Physical Exam Alejandro Bouchard, FNP-C; 08/24/2016 4:51 PM) General Mental Status-Alert. General Appearance-Cooperative, Appears stated age, Not in acute distress. Orientation-Oriented X3. Build & Nutrition-Well built(overweight).  Head and Neck Thyroid Gland Characteristics - no palpable nodules, no palpable enlargement.  Chest and Lung Exam Palpation Tender - No chest wall tenderness.  Cardiovascular Cardiovascular examination reveals -normal heart sounds, regular rate and rhythm with no murmurs, carotid auscultation reveals no bruits, femoral artery auscultation bilaterally reveals normal pulses, no bruits, no thrills and normal pedal pulses bilaterally. Inspection Jugular vein - Right - No Distention.  Abdomen Palpation/Percussion Normal exam - Non Tender and No hepatosplenomegaly. Auscultation Normal exam - Bowel sounds normal.  Peripheral Vascular Lower Extremity Palpation - Edema - Bilateral - 1+ Pitting  edema.  Neurologic Motor-Grossly intact without any focal deficits.  Musculoskeletal Global Assessment Left Lower Extremity - normal range of motion without pain. Right Lower Extremity - normal range of motion without pain.   Results Alejandro Bouchard FNP-C; 08/24/2016 4:51 PM) Procedures  Name Value Date Echocardiography, transthoracic, real-time  with image documentation (2D), includes M-mode recording, when performed, complete, with spectral Doppler echocardiography, and with color flow Doppler echocardiography (16109) Comments: Echocardiogram 08/09/2016: Left ventricle cavity is normal in size. Mild concentric hypertrophy of the left ventricle. Normal global wall motion. Normal diastolic filling pattern. Calculated EF 63%. Left atrial cavity is mild to moderately dilated at 4.4 cm. Mild (Grade I) mitral regurgitation. Mild tricuspid regurgitation. No evidence of pulmonary hypertension.  Performed: 08/10/2016 11:43 AM Myocardial perfusion imaging, tomographic (SPECT) (including attenuation correction, qualitative or quantitative wall motion, ejection fraction by first pass or gated technique, additional quantification, when performed); multiple studies, Comments: Lexiscan sestamibi stress test 08/12/2016: 1. Resting EKG demonstrates normal sinus rhythm. Patient initially attempted treadmill exercise stress test in which he was able to exercise for 6:49 minutes and and achieved 8.30 METs, 79% of MPHR, stress test changed to Lexiscan due to chest pain and dyspnea and ST segment changes. With exercise, patient developed 4 mm ST segment depression with T-wave inversion that persisted for 2 minutes into recovery. Stress test was markedly abnormal and positive for myocardial ischemia. There were no EKG chagnes with Lexiscan infusion. 2. SPECT images demonstrate Medium perfusion abnormality of moderate intensity in the basal inferior, mid inferior and apical inferior myocardial wall(s) on  the stress images. In addition, there is a small perfusion abnormality of moderate intensity in the apical lateral myocardial wall(s) on the stress images. The inferior wall defect appears to be soft tissue attenuation and the small apical lateral defect a small area of ischemia. Gated SPECT images reveal normal myocardial thickening and wall motion. The left ventricular ejection fraction was calculated or visually estimated to be 69%. This is an intermediate risk study, clinical correlation recommended in view of abnormal EKG at submaximal exercise and associated chest pain.  Performed: 08/12/2016 1:59 PM    Assessment & Plan Alejandro Bouchard FNP-C; 08/24/2016 4:51 PM) Exertional chest pain (R07.9) Current Plans Complete electrocardiogram (93000) METABOLIC PANEL, BASIC (60454) CBC & PLATELETS (AUTO) (85027) PT (PROTHROMBIN TIME) (09811) Abnormal stress test (R94.39) Story: Lexiscan sestamibi stress test 08/12/2016: 1. Resting EKG demonstrates normal sinus rhythm. Patient initially attempted treadmill exercise stress test in which he was able to exercise for 6:49 minutes and and achieved 8.30 METs, 79% of MPHR, stress test changed to Lexiscan due to chest pain and dyspnea and ST segment changes. With exercise, patient developed 4 mm ST segment depression with T-wave inversion that persisted for 2 minutes into recovery. Stress test was markedly abnormal and positive for myocardial ischemia. There were no EKG chagnes with Lexiscan infusion. 2. SPECT images demonstrate Medium perfusion abnormality of moderate intensity in the basal inferior, mid inferior and apical inferior myocardial wall(s) on the stress images. In addition, there is a small perfusion abnormality of moderate intensity in the apical lateral myocardial wall(s) on the stress images. The inferior wall defect appears to be soft tissue attenuation and the small apical lateral defect a small area of ischemia. Gated SPECT images reveal  normal myocardial thickening and wall motion. The left ventricular ejection fraction was calculated or visually estimated to be 69%. This is an intermediate risk study, clinical correlation recommended in view of abnormal EKG at submaximal exercise and associated chest pain. Atherosclerosis of native coronary artery of native heart without angina pectoris (I25.10) Story: Heart catheterization 10/22/10: Mid LAD 90% to 0% with 3.5x28 mm Promus (Post dilatation with 3.22mm balloon). Repeat heart catheterization done for a abnormal stress test due to chest pain on 11/16/10: Stent widely  patent.  Lexiscan sestamibi stress test 08/12/2016: 1. Resting EKG demonstrates normal sinus rhythm. Patient initially attempted treadmill exercise stress test in which he was able to exercise for 6:49 minutes and and achieved 8.30 METs, 79% of MPHR, stress test changed to Lexiscan due to chest pain and dyspnea and ST segment changes. With exercise, patient developed 4 mm ST segment depression with T-wave inversion that persisted for 2 minutes into recovery. Stress test was markedly abnormal and positive for myocardial ischemia. There were no EKG chagnes with Lexiscan infusion. 2. SPECT images demonstrate Medium perfusion abnormality of moderate intensity in the basal inferior, mid inferior and apical inferior myocardial wall(s) on the stress images. In addition, there is a small perfusion abnormality of moderate intensity in the apical lateral myocardial wall(s) on the stress images. The inferior wall defect appears to be soft tissue attenuation and the small apical lateral defect a small area of ischemia. Gated SPECT images reveal normal myocardial thickening and wall motion. The left ventricular ejection fraction was calculated or visually estimated to be 69%. This is an intermediate risk study, clinical correlation recommended in view of abnormal EKG at submaximal exercise and associated chest pain. Impression: 08/24/2016: Normal  sinus rhythm at 62 bpm, normal axis, compared to previous EKG bicuspid T-wave in the anteroseptal and lateral leads is now present. T-wave flattening in the inferior leads. Abnormal EKG. Unchanged from EKG 08/10/2016.  EKG 05/25/2016: Normal sinus rhythm at rate of 58 bpm, normal axis. No evidence of ischemia. No significant change from EKG 09/15/2015. Abnormal EKG (R94.31) Story: Echocardiogram 08/10/2016: Left ventricle cavity is normal in size. Mild concentric hypertrophy of the left ventricle. Normal global wall motion. Normal diastolic filling pattern. Calculated EF 63%. Left atrial cavity is mild to moderately dilated at 4.4 cm. Mild (Grade I) mitral regurgitation. Mild tricuspid regurgitation. No evidence of pulmonary hypertension. Pure hypercholesterolemia (E78.00) Tobacco use disorder (F17.200) Labwork Story: 08/03/2016: Hemoglobin 12.4, hematocrit 36.5, normal indices, CBC otherwise normal. Creatinine 0.98, potassium 4.0, calcium 10.6, CMP otherwise normal. Cholesterol 130, triglycerides 92, HDL 35, LDL 77.  03/04/2015: Total cholesterol 207, triglycerides 109, HDL 56, LDL 129, creatinine 0.9, potassium 4.2, CMP normal, CBC normal, TSH 2.37, PSA normal  Labs 09/23/2014: Total cholesterol 162, triglycerides 134, HDL 46, LDL 89, LDL particle # 1158, LP-IR score 70, creatinine 0.99, CMP normal  Labs 01/13/2014: Total cholesterol 163, triglycerides 148, HDL 42, LDL 91, LDL-P 1257, LP-IR score 60, suggestive of insulin resistance Essential hypertension, benign (I10) Current Plans Mechanism of underlying disease process and action of medications discussed with the patient. I discussed primary/secondary prevention and also dietary counseling was done. Patient presents for acute visit for worsening chest pain. He was recently seen by us for acute visit for exertional chest pain. He was found to have abnormal EKG and underwent Lexiscan nuclear stress testing that showed 4 mm ST segment depression  with T-wave inversion. He was also noted to have perfusion abnormality in the basal inferior, mid inferior, and apical inferior myocardial wall. Stress test was seen and abnormal and felt clinical correlation is recommended. Echocardiogram was essentially normal with mild concentric hypertrophy, normal LVEF. Discussed the results with patient patient was reassured. Given his abnormal stress test and worsening symptoms, we'll proceed with coronary angiogram for further evaluation. Schedule for cardiac catheterization, and possible angioplasty. We discussed regarding risks, benefits, alternatives to this including stress testing, CTA and continued medical therapy. Patient wants to proceed. Understands <1-2% risk of death, stroke, MI, urgent CABG, bleeding, infection, renal  failure but not limited to these. No changes were made to medications today. EKG continues to be abnormal, but unchanged from previous EKG. Blood pressure is stable. I have advised him to proceed to ER if nitroglycerin does not resolve chest pain during the interim.  I have congratulated him on his efforts of smoking cessation, but cautioned him to not contiue to work to continue to quit smoking as Vape can be as harmful as cigarettes.  *I have discussed this case with Dr. Jacinto Halim and he personally examined the patient and participated in formulating the plan.*   CC: Dr. Gabriel Earing.  Signed electronically by Alejandro Bouchard, FNP-C (08/24/2016 4:52 PM)

## 2016-08-30 ENCOUNTER — Ambulatory Visit (HOSPITAL_COMMUNITY)
Admission: RE | Admit: 2016-08-30 | Discharge: 2016-08-30 | Disposition: A | Discharge status: Home / Self Care | Payer: BLUE CROSS/BLUE SHIELD | Source: Ambulatory Visit | Attending: Cardiology | Admitting: Cardiology

## 2016-08-30 ENCOUNTER — Encounter (HOSPITAL_COMMUNITY)
Admission: RE | Disposition: A | Discharge status: Home / Self Care | Payer: Self-pay | Source: Ambulatory Visit | Attending: Cardiology

## 2016-08-30 DIAGNOSIS — I2510 Atherosclerotic heart disease of native coronary artery without angina pectoris: Secondary | ICD-10-CM | POA: Diagnosis present

## 2016-08-30 DIAGNOSIS — Z955 Presence of coronary angioplasty implant and graft: Secondary | ICD-10-CM | POA: Insufficient documentation

## 2016-08-30 DIAGNOSIS — Z87891 Personal history of nicotine dependence: Secondary | ICD-10-CM | POA: Insufficient documentation

## 2016-08-30 DIAGNOSIS — Z91040 Latex allergy status: Secondary | ICD-10-CM | POA: Diagnosis not present

## 2016-08-30 DIAGNOSIS — I25118 Atherosclerotic heart disease of native coronary artery with other forms of angina pectoris: Secondary | ICD-10-CM | POA: Diagnosis not present

## 2016-08-30 DIAGNOSIS — Z7982 Long term (current) use of aspirin: Secondary | ICD-10-CM | POA: Insufficient documentation

## 2016-08-30 DIAGNOSIS — I10 Essential (primary) hypertension: Secondary | ICD-10-CM | POA: Diagnosis not present

## 2016-08-30 DIAGNOSIS — R9431 Abnormal electrocardiogram [ECG] [EKG]: Secondary | ICD-10-CM | POA: Insufficient documentation

## 2016-08-30 DIAGNOSIS — N529 Male erectile dysfunction, unspecified: Secondary | ICD-10-CM | POA: Insufficient documentation

## 2016-08-30 DIAGNOSIS — E7800 Pure hypercholesterolemia, unspecified: Secondary | ICD-10-CM | POA: Diagnosis not present

## 2016-08-30 HISTORY — PX: LEFT HEART CATH AND CORONARY ANGIOGRAPHY: CATH118249

## 2016-08-30 SURGERY — LEFT HEART CATH AND CORONARY ANGIOGRAPHY
Anesthesia: LOCAL

## 2016-08-30 MED ORDER — LIDOCAINE HCL 1 % IJ SOLN
INTRAMUSCULAR | Status: AC
  Filled 2016-08-30: qty 20

## 2016-08-30 MED ORDER — NITROGLYCERIN 1 MG/10 ML FOR IR/CATH LAB
INTRA_ARTERIAL | Status: AC
  Filled 2016-08-30: qty 10

## 2016-08-30 MED ORDER — IOPAMIDOL (ISOVUE-370) INJECTION 76%
INTRAVENOUS | Status: DC | PRN
  Administered 2016-08-30: 60 mL via INTRA_ARTERIAL

## 2016-08-30 MED ORDER — MIDAZOLAM HCL 2 MG/2ML IJ SOLN
INTRAMUSCULAR | Status: DC | PRN
  Administered 2016-08-30 (×2): 1 mg via INTRAVENOUS

## 2016-08-30 MED ORDER — SODIUM CHLORIDE 0.9 % WEIGHT BASED INFUSION: 1.0000 mL/kg/h | INTRAVENOUS | Status: AC

## 2016-08-30 MED ORDER — SODIUM CHLORIDE 0.9 % WEIGHT BASED INFUSION
1.0000 mL/kg/h | INTRAVENOUS | Status: DC
Start: 2016-08-30 — End: 2016-08-30
  Administered 2016-08-30: 500 mL via INTRAVENOUS

## 2016-08-30 MED ORDER — HYDROMORPHONE HCL 1 MG/ML IJ SOLN
INTRAMUSCULAR | Status: AC
  Filled 2016-08-30: qty 0.5

## 2016-08-30 MED ORDER — LIDOCAINE HCL (PF) 1 % IJ SOLN
INTRAMUSCULAR | Status: DC | PRN
  Administered 2016-08-30: 1 mL

## 2016-08-30 MED ORDER — ASPIRIN 81 MG PO CHEW
CHEWABLE_TABLET | ORAL | Status: AC
  Filled 2016-08-30: qty 1

## 2016-08-30 MED ORDER — SODIUM CHLORIDE 0.9% FLUSH: 3.0000 mL | INTRAVENOUS | Status: DC | PRN

## 2016-08-30 MED ORDER — HEPARIN (PORCINE) IN NACL 2-0.9 UNIT/ML-% IJ SOLN
INTRAMUSCULAR | Status: AC
  Filled 2016-08-30: qty 1000

## 2016-08-30 MED ORDER — IOPAMIDOL (ISOVUE-370) INJECTION 76%
INTRAVENOUS | Status: AC
  Filled 2016-08-30: qty 100

## 2016-08-30 MED ORDER — RANOLAZINE ER 1000 MG PO TB12: 1000.0000 mg | ORAL_TABLET | Freq: Two times a day (BID) | ORAL | 3 refills | Status: DC

## 2016-08-30 MED ORDER — HEPARIN SODIUM (PORCINE) 1000 UNIT/ML IJ SOLN
INTRAMUSCULAR | Status: AC
  Filled 2016-08-30: qty 1

## 2016-08-30 MED ORDER — SODIUM CHLORIDE 0.9% FLUSH: 3.0000 mL | Freq: Two times a day (BID) | INTRAVENOUS | Status: DC

## 2016-08-30 MED ORDER — ASPIRIN 81 MG PO CHEW: 81.0000 mg | CHEWABLE_TABLET | ORAL | Status: DC

## 2016-08-30 MED ORDER — MIDAZOLAM HCL 2 MG/2ML IJ SOLN
INTRAMUSCULAR | Status: AC
  Filled 2016-08-30: qty 2

## 2016-08-30 MED ORDER — SODIUM CHLORIDE 0.9 % WEIGHT BASED INFUSION
3.0000 mL/kg/h | INTRAVENOUS | Status: AC
  Administered 2016-08-30: 3 mL/kg/h via INTRAVENOUS

## 2016-08-30 MED ORDER — SODIUM CHLORIDE 0.9 % IV SOLN: 250.0000 mL | INTRAVENOUS | Status: DC | PRN

## 2016-08-30 MED ORDER — HEPARIN SODIUM (PORCINE) 1000 UNIT/ML IJ SOLN
INTRAMUSCULAR | Status: DC | PRN
  Administered 2016-08-30: 7000 [IU] via INTRAVENOUS

## 2016-08-30 MED ORDER — HYDROMORPHONE HCL 1 MG/ML IJ SOLN
INTRAMUSCULAR | Status: DC | PRN
  Administered 2016-08-30: 0.5 mg via INTRAVENOUS

## 2016-08-30 MED ORDER — VERAPAMIL HCL 2.5 MG/ML IV SOLN
INTRA_ARTERIAL | Status: DC | PRN
  Administered 2016-08-30: 5 mL via INTRA_ARTERIAL

## 2016-08-30 MED ORDER — VERAPAMIL HCL 2.5 MG/ML IV SOLN
INTRAVENOUS | Status: AC
  Filled 2016-08-30: qty 2

## 2016-08-30 SURGICAL SUPPLY — 9 items
CATH OPTITORQUE TIG 4.0 5F (CATHETERS) ×1 IMPLANT
DEVICE RAD COMP TR BAND LRG (VASCULAR PRODUCTS) ×1 IMPLANT
GLIDESHEATH SLEND A-KIT 6F 20G (SHEATH) ×1 IMPLANT
GUIDEWIRE INQWIRE 1.5J.035X260 (WIRE) IMPLANT
INQWIRE 1.5J .035X260CM (WIRE) ×2
KIT HEART LEFT (KITS) ×2 IMPLANT
PACK CARDIAC CATHETERIZATION (CUSTOM PROCEDURE TRAY) ×2 IMPLANT
TRANSDUCER W/STOPCOCK (MISCELLANEOUS) ×2 IMPLANT
TUBING CIL FLEX 10 FLL-RA (TUBING) ×2 IMPLANT

## 2016-08-30 NOTE — Discharge Instructions (Signed)

## 2016-08-30 NOTE — Interval H&P Note (Signed)
History and Physical Interval Note:  08/30/2016 11:37 AM  Alejandro Parker  has presented today for surgery, with the diagnosis of cp, positive stress test  The various methods of treatment have been discussed with the patient and family. After consideration of risks, benefits and other options for treatment, the patient has consented to  Procedure(s): Left Heart Cath and Coronary Angiography (N/A) and possible PCI as a surgical intervention .  The patient's history has been reviewed, patient examined, no change in status, stable for surgery.  I have reviewed the patient's chart and labs.  Questions were answered to the patient's satisfaction.   Ischemic Symptoms? CCS III (Marked limitation of ordinary activity) Anti-ischemic Medical Therapy? Maximal Medical Therapy (2 or more classes of medications) Non-invasive Test Results? High-risk stress test findings: cardiac mortality >3%/yr Prior CABG? No Previous CABG   Patient Information:   1-2V CAD, no prox LAD  A (9)  Indication: 19; Score: 9   Patient Information:   CTO of 1 vessel, no other CAD  A (8)  Indication: 29; Score: 8   Patient Information:   1V CAD with prox LAD  A (9)  Indication: 35; Score: 9   Patient Information:   2V-CAD with prox LAD  A (9)  Indication: 41; Score: 9   Patient Information:   3V-CAD without LMCA  A (9)  Indication: 47; Score: 9   Patient Information:   3V-CAD without LMCA With Abnormal LV systolic function  A (9)  Indication: 48; Score: 9   Patient Information:   LMCA-CAD  A (9)  Indication: 49; Score: 9   Patient Information:   2V-CAD with prox LAD PCI  A (7)  Indication: 62; Score: 7   Patient Information:   2V-CAD with prox LAD CABG  A (8)  Indication: 62; Score: 8   Patient Information:   3V-CAD without LMCA With Low CAD burden(i.e., 3 focal stenoses, low SYNTAX score) PCI  A (7)  Indication: 63; Score: 7   Patient Information:   3V-CAD without  LMCA With Low CAD burden(i.e., 3 focal stenoses, low SYNTAX score) CABG  A (9)  Indication: 63; Score: 9   Patient Information:   3V-CAD without LMCA E06c - Intermediate-high CAD burden (i.e., multiple diffuse lesions, presence of CTO, or high SYNTAX score) PCI  U (4)  Indication: 64; Score: 4   Patient Information:   3V-CAD without LMCA E06c - Intermediate-high CAD burden (i.e., multiple diffuse lesions, presence of CTO, or high SYNTAX score) CABG  A (9)  Indication: 64; Score: 9   Patient Information:   LMCA-CAD With Isolated LMCA stenosis  PCI  U (6)  Indication: 65; Score: 6   Patient Information:   LMCA-CAD With Isolated LMCA stenosis  CABG  A (9)  Indication: 65; Score: 9   Patient Information:   LMCA-CAD Additional CAD, low CAD burden (i.e., 1- to 2-vessel additional involvement, low SYNTAX score) PCI  U (5)  Indication: 66; Score: 5   Patient Information:   LMCA-CAD Additional CAD, low CAD burden (i.e., 1- to 2-vessel additional involvement, low SYNTAX score) CABG  A (9)  Indication: 66; Score: 9   Patient Information:   LMCA-CAD Additional CAD, intermediate-high CAD burden (i.e., 3-vessel involvement, presence of CTO, or high SYNTAX score) PCI  I (3)  Indication: 67; Score: 3   Patient Information:   LMCA-CAD Additional CAD, intermediate-high CAD burden (i.e., 3-vessel involvement, presence of CTO, or high SYNTAX score) CABG  A (9)  Indication: 67;  Score: 9   Alejandro Parker

## 2016-08-31 ENCOUNTER — Encounter (HOSPITAL_COMMUNITY): Payer: Self-pay | Admitting: Cardiology

## 2016-09-27 ENCOUNTER — Encounter (HOSPITAL_COMMUNITY): Payer: Self-pay | Admitting: Emergency Medicine

## 2016-09-27 ENCOUNTER — Emergency Department (HOSPITAL_COMMUNITY)
Admission: EM | Admit: 2016-09-27 | Discharge: 2016-09-27 | Disposition: A | Discharge status: Home / Self Care | Payer: BLUE CROSS/BLUE SHIELD | Attending: Emergency Medicine | Admitting: Emergency Medicine

## 2016-09-27 ENCOUNTER — Emergency Department (HOSPITAL_COMMUNITY): Payer: BLUE CROSS/BLUE SHIELD

## 2016-09-27 DIAGNOSIS — E785 Hyperlipidemia, unspecified: Secondary | ICD-10-CM | POA: Diagnosis not present

## 2016-09-27 DIAGNOSIS — R079 Chest pain, unspecified: Secondary | ICD-10-CM | POA: Diagnosis not present

## 2016-09-27 DIAGNOSIS — Z91040 Latex allergy status: Secondary | ICD-10-CM | POA: Insufficient documentation

## 2016-09-27 DIAGNOSIS — Z7982 Long term (current) use of aspirin: Secondary | ICD-10-CM | POA: Insufficient documentation

## 2016-09-27 DIAGNOSIS — I2510 Atherosclerotic heart disease of native coronary artery without angina pectoris: Secondary | ICD-10-CM | POA: Diagnosis not present

## 2016-09-27 DIAGNOSIS — F17200 Nicotine dependence, unspecified, uncomplicated: Secondary | ICD-10-CM | POA: Diagnosis not present

## 2016-09-27 DIAGNOSIS — I10 Essential (primary) hypertension: Secondary | ICD-10-CM | POA: Diagnosis not present

## 2016-09-27 DIAGNOSIS — Z79899 Other long term (current) drug therapy: Secondary | ICD-10-CM | POA: Insufficient documentation

## 2016-09-27 LAB — CBC WITH DIFFERENTIAL/PLATELET
BASOS ABS: 0 10*3/uL (ref 0.0–0.1)
BASOS PCT: 0 %
EOS ABS: 0.2 10*3/uL (ref 0.0–0.7)
Eosinophils Relative: 5 %
HCT: 27.7 % — ABNORMAL LOW (ref 39.0–52.0)
HEMOGLOBIN: 8.9 g/dL — AB (ref 13.0–17.0)
Lymphocytes Relative: 40 %
Lymphs Abs: 2.1 10*3/uL (ref 0.7–4.0)
MCH: 26.3 pg (ref 26.0–34.0)
MCHC: 32.1 g/dL (ref 30.0–36.0)
MCV: 82 fL (ref 78.0–100.0)
MONO ABS: 0.8 10*3/uL (ref 0.1–1.0)
MONOS PCT: 16 %
NEUTROS PCT: 39 %
Neutro Abs: 2 10*3/uL (ref 1.7–7.7)
Platelets: 131 10*3/uL — ABNORMAL LOW (ref 150–400)
RBC: 3.38 MIL/uL — ABNORMAL LOW (ref 4.22–5.81)
RDW: 13 % (ref 11.5–15.5)
WBC: 5.1 10*3/uL (ref 4.0–10.5)

## 2016-09-27 LAB — COMPREHENSIVE METABOLIC PANEL
ALT: 46 U/L (ref 17–63)
ANION GAP: 6 (ref 5–15)
AST: 37 U/L (ref 15–41)
Albumin: 3.1 g/dL — ABNORMAL LOW (ref 3.5–5.0)
Alkaline Phosphatase: 72 U/L (ref 38–126)
BILIRUBIN TOTAL: 0.5 mg/dL (ref 0.3–1.2)
BUN: 16 mg/dL (ref 6–20)
CHLORIDE: 107 mmol/L (ref 101–111)
CO2: 27 mmol/L (ref 22–32)
Calcium: 10.3 mg/dL (ref 8.9–10.3)
Creatinine, Ser: 0.83 mg/dL (ref 0.61–1.24)
GFR calc Af Amer: >60 mL/min (ref >60)
Glucose, Bld: 120 mg/dL — ABNORMAL HIGH (ref 65–99)
POTASSIUM: 3.7 mmol/L (ref 3.5–5.1)
Sodium: 140 mmol/L (ref 135–145)
TOTAL PROTEIN: 5.5 g/dL — AB (ref 6.5–8.1)

## 2016-09-27 LAB — LIPID PANEL
CHOLESTEROL: 87 mg/dL (ref 0–200)
HDL: 26 mg/dL — AB (ref >40)
LDL Cholesterol: 40 mg/dL (ref 0–99)
TRIGLYCERIDES: 103 mg/dL (ref <150)
Total CHOL/HDL Ratio: 3.3 RATIO
VLDL: 21 mg/dL (ref 0–40)

## 2016-09-27 LAB — APTT: APTT: 37 s — AB (ref 24–36)

## 2016-09-27 LAB — PROTIME-INR
INR: 1.17
PROTHROMBIN TIME: 14.9 s (ref 11.4–15.2)

## 2016-09-27 LAB — TROPONIN I

## 2016-09-27 MED ORDER — ISOSORBIDE MONONITRATE ER 60 MG PO TB24: 60.0000 mg | ORAL_TABLET | Freq: Every day | ORAL | 0 refills | Status: DC

## 2016-09-27 MED ORDER — SODIUM CHLORIDE 0.9 % IV SOLN: INTRAVENOUS | Status: DC

## 2016-09-27 NOTE — ED Triage Notes (Signed)
Patient presents today with complaints of substernal chest pain. Patient reports he had a cath 08/31/2016. Patient reports still having chest pain. Patient reports he has taken 5 nitro yesterday and 4 today. Patient was given 324 AsA by [facility]. Patient alert and oriented *3.

## 2016-09-27 NOTE — Discharge Instructions (Signed)
Return to the ED with any concerns including difficulty breathing, fainting, vomiting and not able to keep down liquids, fever/chills, leg swelling, decreased level of alertness/lethargy, or any other alarming symptoms

## 2016-09-27 NOTE — ED Notes (Signed)
Family at bedside.pt resting , updated family on plan of care

## 2016-09-27 NOTE — ED Notes (Signed)
Returned from xray

## 2016-09-27 NOTE — ED Notes (Signed)
Patient transported to X-ray 

## 2016-09-27 NOTE — ED Notes (Signed)
Patient denies chest pain at this time. Wearing NTG patch applied by EMS. Has taken 4 NTG tabs today at home, 5 NTG tabs yesterday, and a total of 50 NTG over one month period.

## 2016-09-27 NOTE — ED Provider Notes (Signed)
[facility]ENCY DEPT Provider Note   CSN: [id number] Arrival date & time: 09/27/16  1010     History   Chief Complaint Chief Complaint  Patient presents with  . Chest Pain    HPI Alejandro CreaseJames M Kaspar is a 61 y.o. male.  HPI  Pt with hx of CAD and stent in LAD presenting with ongoing c/o chest pain.  He states he frequently has chest pain and feelings of shortness of breath.  He recently had cath performed last month by Dr. Jacinto HalimGanji and stent was patent.  He states Dr. Jacinto HalimGanji cleared that his heart was not the cause of his pain. [facility] and recently was told he is hyperthyroid- they are in the process of setting him up for appointment with endocrinologist- he states he went to an urgent care today to see if he could be seen sooner by an endocrinologist if he went there today, but when he mentioned his chest pain they referred him to come to the ED.  He states he takes nitroglycerin frequently for his chest pain and this is the only thing that helps the pain.  There are no other associated systemic symptoms, there are no other alleviating or modifying factors.   Past Medical History:  Diagnosis Date  . Allergic rhinitis   . Corns and callosities 07/23/10  . Coronary artery disease   . Erectile dysfunction   . GERD (gastroesophageal reflux disease)   . 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 Hyperlipidemia 08/09/2001  . Hypertension     Patient Active Problem List   Diagnosis Date Noted  . CAD (coronary artery disease), native coronary artery 11/12/2012  . S/P PTCA (percutaneous transluminal coronary angioplasty) 11/12/2012  . Hyperlipidemia 11/12/2012  . Essential hypertension, benign 11/12/2012  . Chest tightness 10/15/2010    Past Surgical History:  Procedure Laterality Date  . CORONARY ANGIOPLASTY WITH STENT PLACEMENT  10/22/10  . CORONARY ANGIOPLASTY WITH STENT PLACEMENT    . LEFT HEART CATH AND CORONARY ANGIOGRAPHY N/A 08/30/2016   Procedure: Left Heart Cath and  Coronary Angiography;  Surgeon: Yates DecampGanji, Jay, MD;  [facility];  Service: Cardiovascular;  Laterality: N/A;       Home Medications    Prior to Admission medications   Medication Sig Start Date End Date Taking? Authorizing Provider  albuterol (PROAIR HFA) 108 (90 BASE) MCG/ACT inhaler Inhale 2 puffs into the lungs every 6 (six) hours as needed. Patient taking differently: Inhale 2 puffs into the lungs every 6 (six) hours as needed for wheezing or shortness of breath.  10/15/10  Yes Clance, Maree KrabbeKeith M, MD  amLODipine (NORVASC) 5 MG tablet Take 5 mg by mouth daily.   Yes [provider]  aspirin 81 MG tablet Take 81 mg by mouth daily.   Yes [provider]  Aspirin-Salicylamide-Caffeine (BC HEADACHE POWDER PO) Take 1 packet by mouth daily as needed (headache).    Yes [provider]  atorvastatin (LIPITOR) 40 MG tablet Take 40 mg by mouth daily. 06/08/16  Yes [provider]  carvedilol (COREG) 3.125 MG tablet Take 3.125 mg by mouth 2 (two) times daily with a meal.   Yes [provider]  Cholecalciferol (VITAMIN D-3) 1000 units CAPS Take 1,000 Units by mouth 2 (two) times daily.   Yes [provider]  diclofenac (VOLTAREN) 50 MG EC tablet Take 50 mg by mouth 2 (two) times daily.   Yes [provider]  Multiple Vitamin (STRESSTABS PO) Take 2  tablets by mouth 2 (two) times daily. 2 tablets every morning & 2 tablets every evening   Yes [provider]  NITROSTAT 0.4 MG SL tablet Place 0.4 mg under the tongue every 5 (five) minutes as needed for chest pain. As directed if needed 10/23/10  Yes [provider]  Omega-3 Fatty Acids (OMEGA-3 FISH OIL PO) Take 1,280 mg by mouth 2 (two) times daily.   Yes [provider]  omeprazole (PRILOSEC) 20 MG capsule Take 1 capsule (20 mg total) by mouth daily. 11/12/12  Yes Benjiman Core, MD  OVER THE COUNTER MEDICATION Take 1 tablet by mouth 2 (two) times daily.  Liver care   Yes [provider]  OVER THE COUNTER MEDICATION Take 1 tablet by mouth 2 (two) times daily. ALJ tablets (herbal supplement)   Yes [provider]  ranolazine (RANEXA) 1000 MG SR tablet Take 1 tablet (1,000 mg total) by mouth 2 (two) times daily. 08/30/16  Yes Yates Decamp, MD  valsartan-hydrochlorothiazide (DIOVAN-HCT) 320-25 MG tablet Take 1 tablet by mouth daily. 06/03/16  Yes [provider]  isosorbide mononitrate (IMDUR) 60 MG 24 hr tablet Take 1 tablet (60 mg total) by mouth daily. 09/27/16   Jerelyn Scott, MD    Family History Family History  Problem Relation Age of Onset  . Heart attack Father   . Hypertension Father   . Ulcers Father        stomach  . Breast cancer Mother     Social History Social History  Substance Use Topics  . Smoking status: Current Every Day Smoker  . Smokeless tobacco: Never Used  . Alcohol use Yes     Comment: occ     Allergies   Aleve [naproxen]; Crestor [rosuvastatin]; Latex; and Other   Review of Systems Review of Systems  ROS reviewed and all otherwise negative except for mentioned in HPI   Physical Exam Updated Vital Signs BP (!) 135/58   Pulse 87   Temp 98.2 F (36.8 C) (Oral)   Resp 14   Ht 6\' 2"  (1.88 m)   Wt 98.9 kg (218 lb)   SpO2 100%   BMI 27.99 kg/m  Vitals reviewed Physical Exam Physical Examination: General appearance - alert, well appearing, and in no distress Mental status - alert, oriented to person, place, and time Eyes - no conjunctival injection, no scleral icterus Mouth - mucous membranes moist, pharynx normal without lesions Neck - supple, no significant adenopathy Chest - clear to auscultation, no wheezes, rales or rhonchi, symmetric air entry Heart - normal rate, regular rhythm, normal S1, S2, no murmurs, rubs, clicks or gallops Abdomen - soft, nontender, nondistended, no masses or organomegaly Neurological - alert, oriented, normal speech Extremities - peripheral  pulses normal, no pedal edema, no clubbing or cyanosis Skin - normal coloration and turgor, no rashes  ED Treatments / Results  Labs (all labs ordered are listed, but only abnormal results are displayed) Labs Reviewed  CBC WITH DIFFERENTIAL/PLATELET - Abnormal; Notable for the following:       Result Value   RBC 3.38 (*)    Hemoglobin 8.9 (*)    HCT 27.7 (*)    Platelets 131 (*)    All other components within normal limits  APTT - Abnormal; Notable for the following:    aPTT 37 (*)    All other components within normal limits  COMPREHENSIVE METABOLIC PANEL - Abnormal; Notable for the following:    Glucose, Bld 120 (*)    Total  Protein 5.5 (*)    Albumin 3.1 (*)    All other components within normal limits  LIPID PANEL - Abnormal; Notable for the following:    HDL 26 (*)    All other components within normal limits  PROTIME-INR  TROPONIN I    EKG  EKG Interpretation  Date/Time:  Tuesday September 27 2016 10:28:22 EDT Ventricular Rate:  85 PR Interval:    QRS Duration: 102 QT Interval:  473 QTC Calculation: 563 R Axis:   49 Text Interpretation:  Sinus rhythm Probable LVH with secondary repol abnrm Prolonged QT interval Since previous tracing of 2012 rate faster LVH is new, nonspecific t wave abnormalities are now present Confirmed by Jerelyn Scott [phone]) on 09/27/2016 12:37:57 PM       Radiology No results found.  Procedures Procedures (including critical care time)  Medications Ordered in ED Medications - No data to display   Initial Impression / Assessment and Plan / ED Course  I have reviewed the triage vital signs and the nursing notes.  Pertinent labs & imaging results that were available during my care of the patient were reviewed by me and considered in my medical decision making (see chart for details).    3:38 PM  D/w Dr. Jacinto Halim, he recommends starting patient on isosorbide mononitrite 60mg  po qD, he feels most of his symptoms are GI related, will give GI  referral.  Also agrees with endrocrinolgy referral which has been initiated by his PMD with [facility].     Final Clinical Impressions(s) / ED Diagnoses   Final diagnoses:  Nonspecific chest pain    New Prescriptions Discharge Medication List as of 09/27/2016  4:04 PM    START taking these medications   Details  isosorbide mononitrate (IMDUR) 60 MG 24 hr tablet Take 1 tablet (60 mg total) by mouth daily., Starting Tue 09/27/2016, Print         Jerelyn Scott, MD 09/29/16 [phone]

## 2016-09-27 NOTE — ED Notes (Signed)
EMS placed 1inch nitro pace

## 2016-10-11 ENCOUNTER — Other Ambulatory Visit: Payer: Self-pay | Admitting: Gastroenterology

## 2016-10-11 DIAGNOSIS — R0789 Other chest pain: Secondary | ICD-10-CM

## 2016-10-14 ENCOUNTER — Ambulatory Visit
Admission: RE | Admit: 2016-10-14 | Discharge: 2016-10-14 | Disposition: A | Discharge status: Home / Self Care | Payer: BLUE CROSS/BLUE SHIELD | Source: Ambulatory Visit | Attending: Gastroenterology | Admitting: Gastroenterology

## 2016-10-14 DIAGNOSIS — R0789 Other chest pain: Secondary | ICD-10-CM

## 2016-10-17 ENCOUNTER — Other Ambulatory Visit: Payer: BLUE CROSS/BLUE SHIELD

## 2017-02-13 DIAGNOSIS — E0500 Thyrotoxicosis with diffuse goiter without thyrotoxic crisis or storm: Secondary | ICD-10-CM | POA: Diagnosis not present

## 2017-02-14 DIAGNOSIS — I10 Essential (primary) hypertension: Secondary | ICD-10-CM | POA: Diagnosis not present

## 2017-02-14 DIAGNOSIS — I2510 Atherosclerotic heart disease of native coronary artery without angina pectoris: Secondary | ICD-10-CM | POA: Diagnosis not present

## 2017-02-14 DIAGNOSIS — R1084 Generalized abdominal pain: Secondary | ICD-10-CM | POA: Diagnosis not present

## 2017-02-14 DIAGNOSIS — K5903 Drug induced constipation: Secondary | ICD-10-CM | POA: Diagnosis not present

## 2017-02-15 DIAGNOSIS — Z0000 Encounter for general adult medical examination without abnormal findings: Secondary | ICD-10-CM | POA: Diagnosis not present

## 2017-02-15 DIAGNOSIS — F17210 Nicotine dependence, cigarettes, uncomplicated: Secondary | ICD-10-CM | POA: Diagnosis not present

## 2017-02-15 DIAGNOSIS — E0500 Thyrotoxicosis with diffuse goiter without thyrotoxic crisis or storm: Secondary | ICD-10-CM | POA: Diagnosis not present

## 2017-02-15 DIAGNOSIS — K219 Gastro-esophageal reflux disease without esophagitis: Secondary | ICD-10-CM | POA: Diagnosis not present

## 2017-04-04 DIAGNOSIS — E0500 Thyrotoxicosis with diffuse goiter without thyrotoxic crisis or storm: Secondary | ICD-10-CM | POA: Diagnosis not present

## 2017-04-07 DIAGNOSIS — I10 Essential (primary) hypertension: Secondary | ICD-10-CM | POA: Diagnosis not present

## 2017-04-07 DIAGNOSIS — E0500 Thyrotoxicosis with diffuse goiter without thyrotoxic crisis or storm: Secondary | ICD-10-CM | POA: Diagnosis not present

## 2017-04-19 DIAGNOSIS — I10 Essential (primary) hypertension: Secondary | ICD-10-CM | POA: Diagnosis not present

## 2017-04-19 DIAGNOSIS — E0590 Thyrotoxicosis, unspecified without thyrotoxic crisis or storm: Secondary | ICD-10-CM | POA: Diagnosis not present

## 2017-04-19 DIAGNOSIS — Z720 Tobacco use: Secondary | ICD-10-CM | POA: Diagnosis not present

## 2017-04-19 DIAGNOSIS — I2510 Atherosclerotic heart disease of native coronary artery without angina pectoris: Secondary | ICD-10-CM | POA: Diagnosis not present

## 2017-05-04 DIAGNOSIS — K5900 Constipation, unspecified: Secondary | ICD-10-CM | POA: Diagnosis not present

## 2017-05-04 DIAGNOSIS — I10 Essential (primary) hypertension: Secondary | ICD-10-CM | POA: Diagnosis not present

## 2017-05-22 DIAGNOSIS — E0500 Thyrotoxicosis with diffuse goiter without thyrotoxic crisis or storm: Secondary | ICD-10-CM | POA: Diagnosis not present

## 2017-06-08 ENCOUNTER — Other Ambulatory Visit: Payer: Self-pay | Admitting: Gastroenterology

## 2017-06-08 DIAGNOSIS — K219 Gastro-esophageal reflux disease without esophagitis: Secondary | ICD-10-CM | POA: Diagnosis not present

## 2017-06-08 DIAGNOSIS — R1084 Generalized abdominal pain: Secondary | ICD-10-CM | POA: Diagnosis not present

## 2017-06-08 DIAGNOSIS — I2510 Atherosclerotic heart disease of native coronary artery without angina pectoris: Secondary | ICD-10-CM | POA: Diagnosis not present

## 2017-06-16 ENCOUNTER — Ambulatory Visit
Admission: RE | Admit: 2017-06-16 | Discharge: 2017-06-16 | Disposition: A | Discharge status: Home / Self Care | Payer: BLUE CROSS/BLUE SHIELD | Source: Ambulatory Visit | Attending: Gastroenterology | Admitting: Gastroenterology

## 2017-06-16 DIAGNOSIS — K429 Umbilical hernia without obstruction or gangrene: Secondary | ICD-10-CM | POA: Diagnosis not present

## 2017-06-16 DIAGNOSIS — R1084 Generalized abdominal pain: Secondary | ICD-10-CM

## 2017-06-16 MED ORDER — IOPAMIDOL (ISOVUE-300) INJECTION 61%
100.0000 mL | Freq: Once | INTRAVENOUS | Status: AC | PRN
  Administered 2017-06-16: 100 mL via INTRAVENOUS

## 2017-07-10 DIAGNOSIS — E0500 Thyrotoxicosis with diffuse goiter without thyrotoxic crisis or storm: Secondary | ICD-10-CM | POA: Diagnosis not present

## 2017-07-11 DIAGNOSIS — R1012 Left upper quadrant pain: Secondary | ICD-10-CM | POA: Diagnosis not present

## 2017-07-11 DIAGNOSIS — R933 Abnormal findings on diagnostic imaging of other parts of digestive tract: Secondary | ICD-10-CM | POA: Diagnosis not present

## 2017-07-11 DIAGNOSIS — R1011 Right upper quadrant pain: Secondary | ICD-10-CM | POA: Diagnosis not present

## 2017-07-17 DIAGNOSIS — K2970 Gastritis, unspecified, without bleeding: Secondary | ICD-10-CM | POA: Diagnosis not present

## 2017-07-17 DIAGNOSIS — R933 Abnormal findings on diagnostic imaging of other parts of digestive tract: Secondary | ICD-10-CM | POA: Diagnosis not present

## 2017-07-17 DIAGNOSIS — K2930 Chronic superficial gastritis without bleeding: Secondary | ICD-10-CM | POA: Diagnosis not present

## 2017-07-17 DIAGNOSIS — R1010 Upper abdominal pain, unspecified: Secondary | ICD-10-CM | POA: Diagnosis not present

## 2017-07-20 DIAGNOSIS — K2930 Chronic superficial gastritis without bleeding: Secondary | ICD-10-CM | POA: Diagnosis not present

## 2017-10-06 DIAGNOSIS — E0500 Thyrotoxicosis with diffuse goiter without thyrotoxic crisis or storm: Secondary | ICD-10-CM | POA: Diagnosis not present

## 2017-10-09 DIAGNOSIS — I10 Essential (primary) hypertension: Secondary | ICD-10-CM | POA: Diagnosis not present

## 2017-10-09 DIAGNOSIS — E0500 Thyrotoxicosis with diffuse goiter without thyrotoxic crisis or storm: Secondary | ICD-10-CM | POA: Diagnosis not present

## 2017-10-09 DIAGNOSIS — E041 Nontoxic single thyroid nodule: Secondary | ICD-10-CM | POA: Diagnosis not present

## 2017-10-29 IMAGING — CR DG CHEST 2V
2 series · 2 of 2 positions shown · non-contrast
Comparison: 12/07/2008

CLINICAL DATA: Mid chest pain

EXAM:
CHEST  2 VIEW

[chest pa]
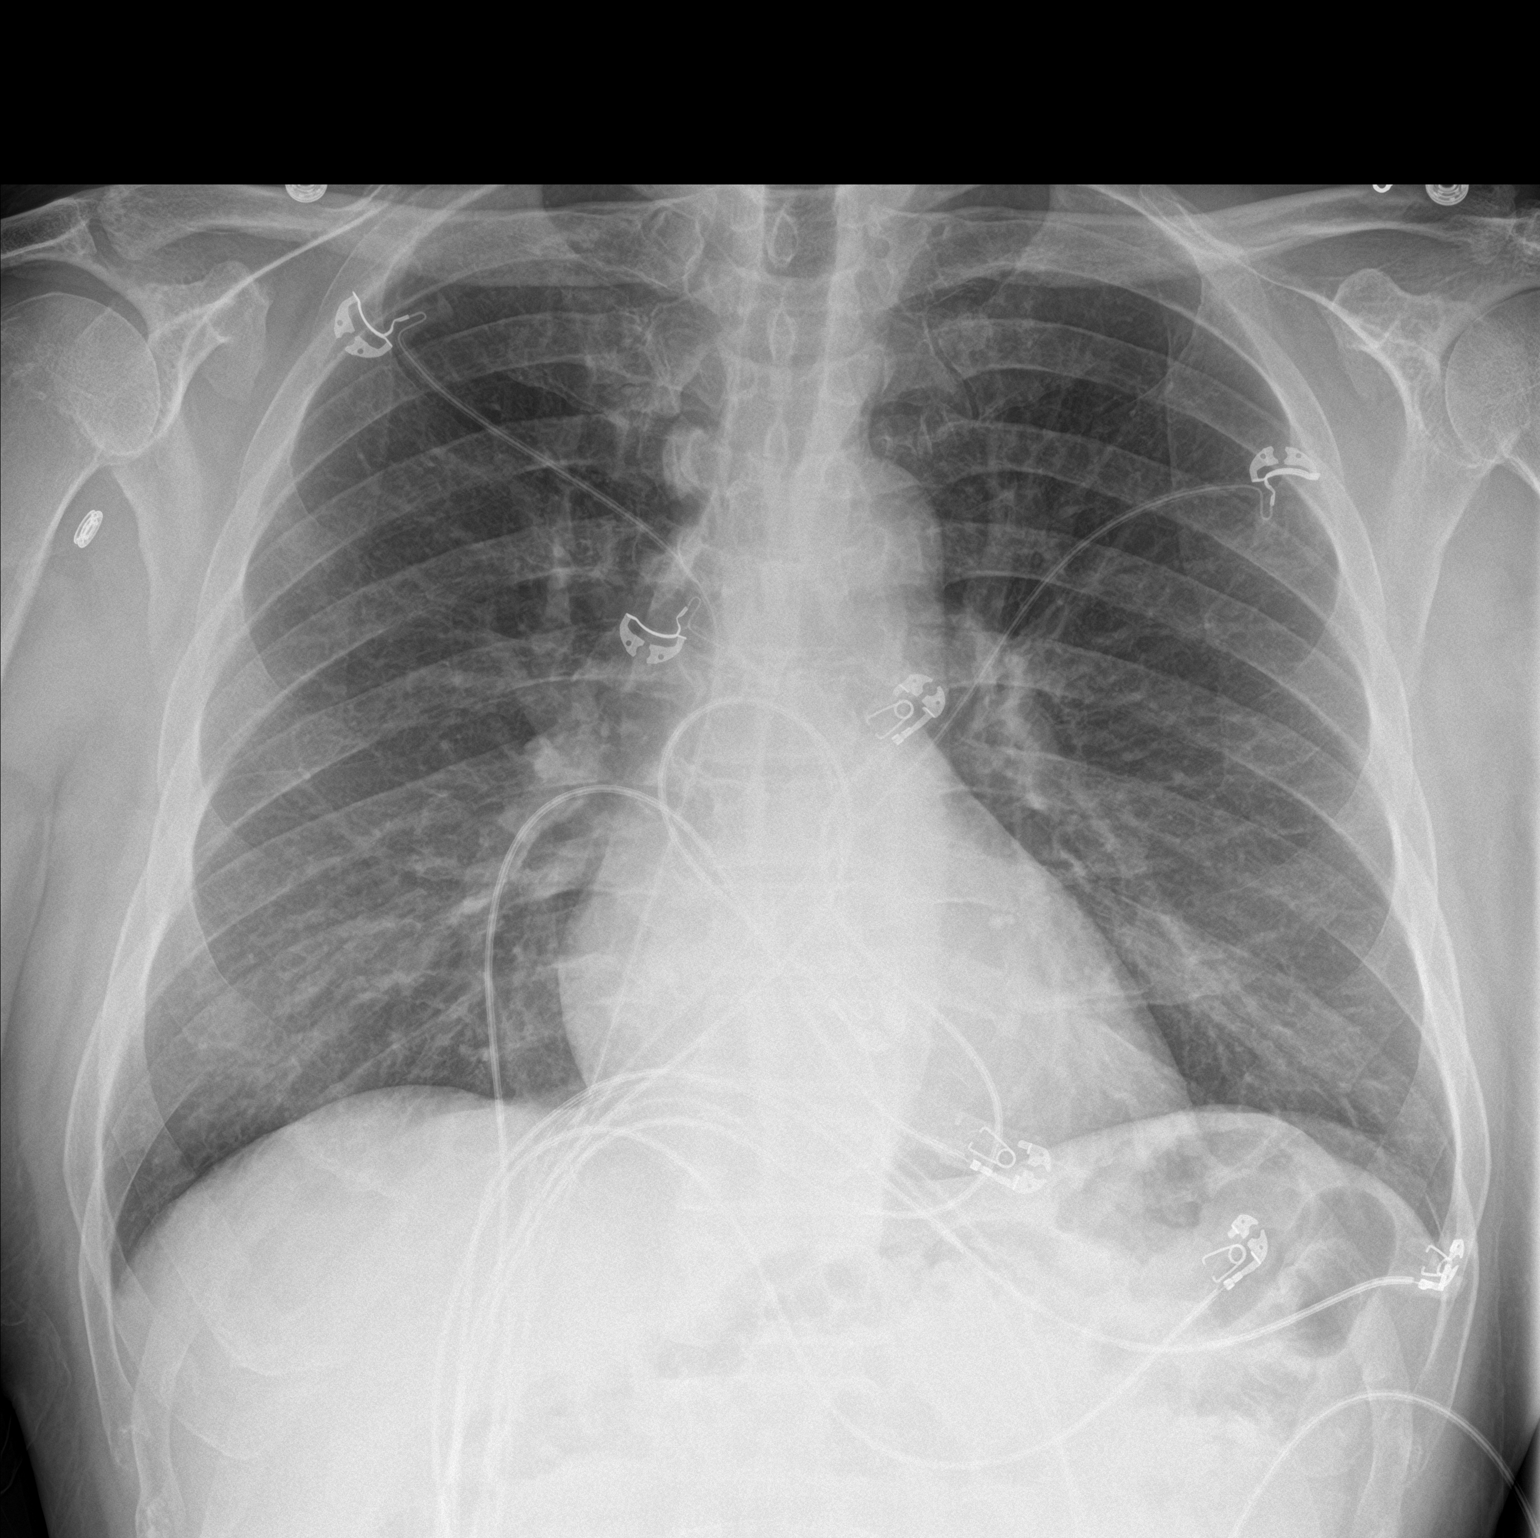

[chest lat]
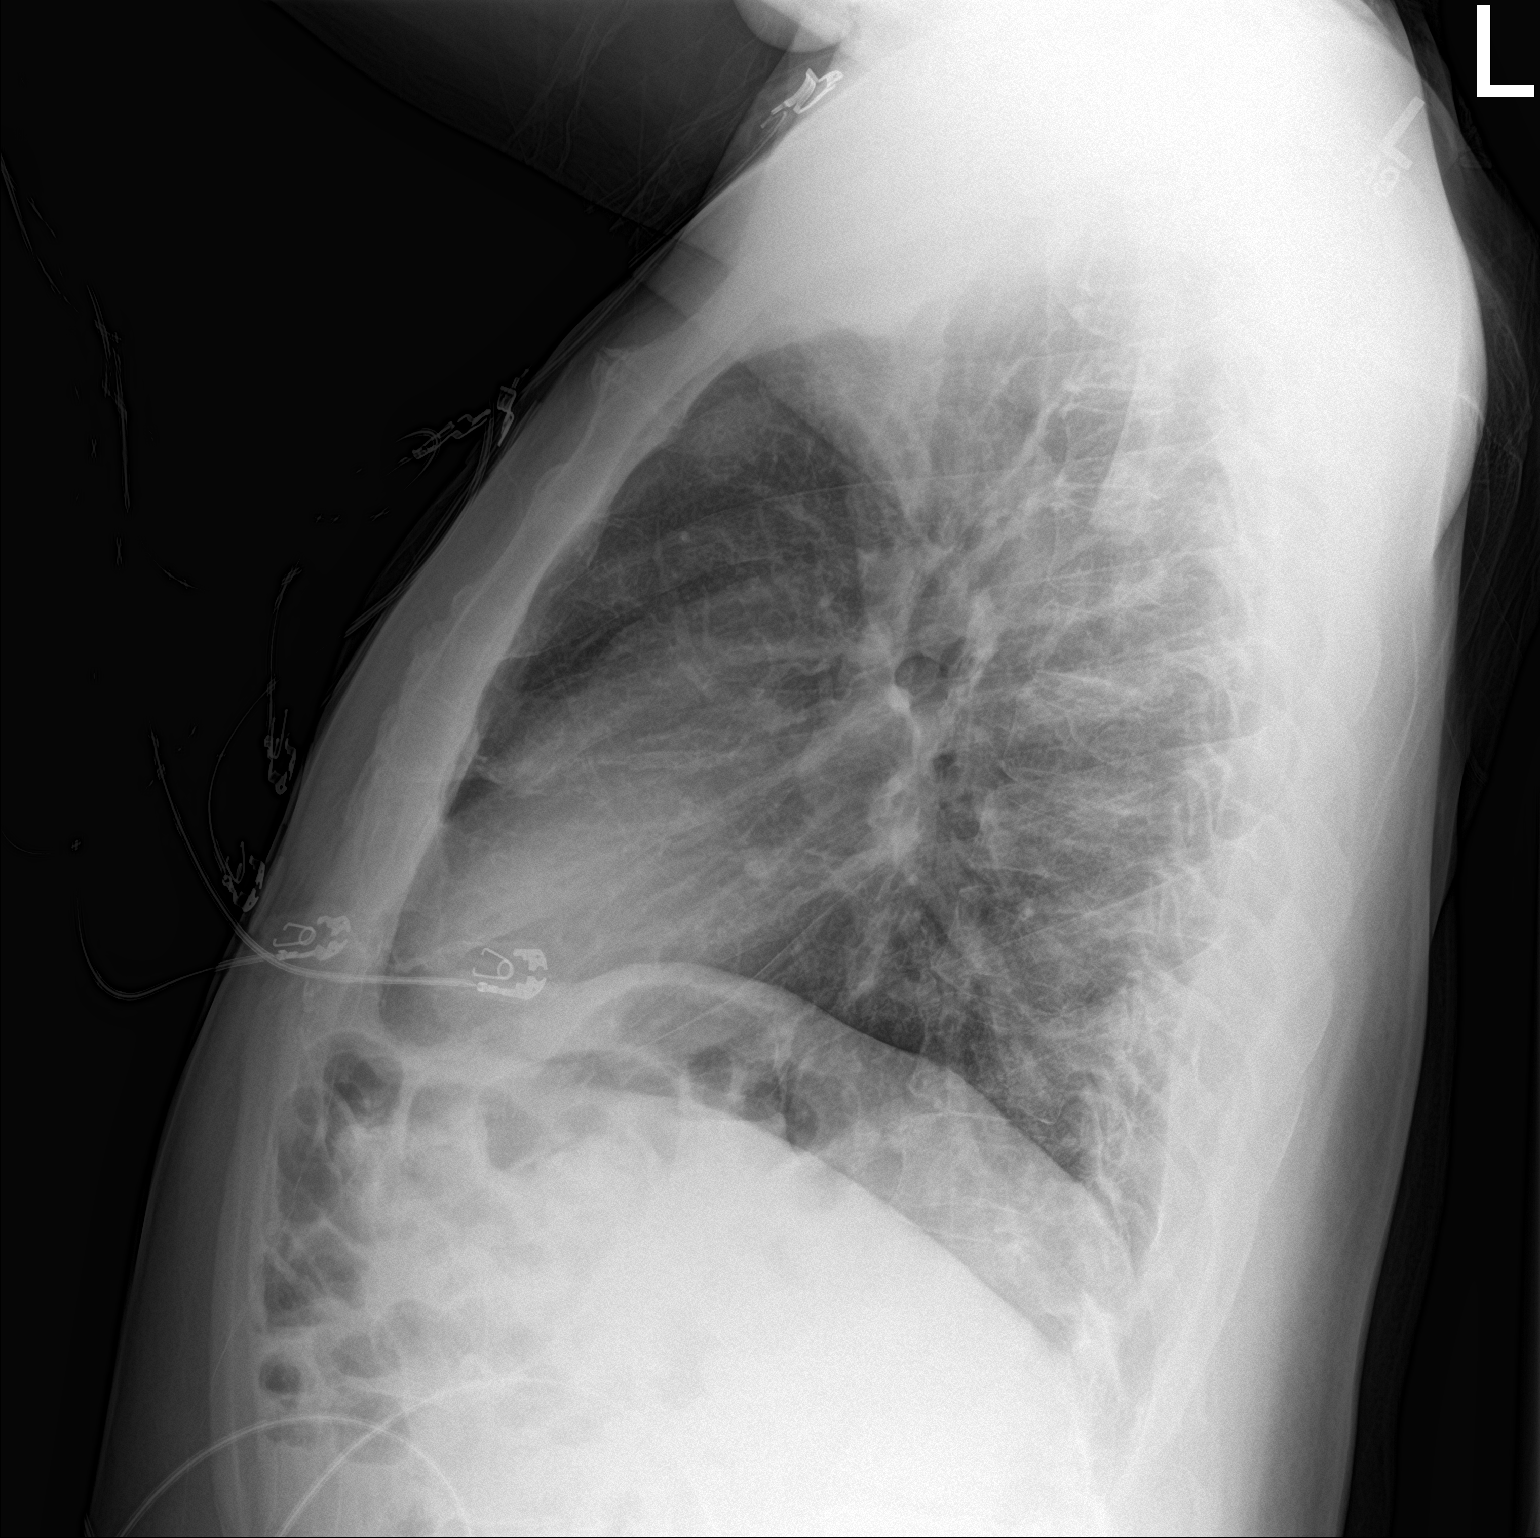

[2 of 2 positions shown; findings below may reference images not displayed]

FINDINGS: Lungs are clear.  No pleural effusion or pneumothorax.

The heart is normal in size.

Visualized osseous structures are within normal limits.
IMPRESSION: Normal chest radiographs.

## 2017-11-14 DIAGNOSIS — E0590 Thyrotoxicosis, unspecified without thyrotoxic crisis or storm: Secondary | ICD-10-CM | POA: Diagnosis not present

## 2017-11-14 DIAGNOSIS — I10 Essential (primary) hypertension: Secondary | ICD-10-CM | POA: Diagnosis not present

## 2017-11-14 DIAGNOSIS — Z720 Tobacco use: Secondary | ICD-10-CM | POA: Diagnosis not present

## 2017-11-14 DIAGNOSIS — I2510 Atherosclerotic heart disease of native coronary artery without angina pectoris: Secondary | ICD-10-CM | POA: Diagnosis not present

## 2017-12-05 DIAGNOSIS — K219 Gastro-esophageal reflux disease without esophagitis: Secondary | ICD-10-CM | POA: Diagnosis not present

## 2017-12-05 DIAGNOSIS — R1084 Generalized abdominal pain: Secondary | ICD-10-CM | POA: Diagnosis not present

## 2017-12-05 DIAGNOSIS — K2930 Chronic superficial gastritis without bleeding: Secondary | ICD-10-CM | POA: Diagnosis not present

## 2018-01-11 DIAGNOSIS — K429 Umbilical hernia without obstruction or gangrene: Secondary | ICD-10-CM | POA: Diagnosis not present

## 2018-01-11 DIAGNOSIS — I10 Essential (primary) hypertension: Secondary | ICD-10-CM | POA: Diagnosis not present

## 2018-01-11 DIAGNOSIS — E0500 Thyrotoxicosis with diffuse goiter without thyrotoxic crisis or storm: Secondary | ICD-10-CM | POA: Diagnosis not present

## 2018-01-11 DIAGNOSIS — Z0000 Encounter for general adult medical examination without abnormal findings: Secondary | ICD-10-CM | POA: Diagnosis not present

## 2018-01-22 DIAGNOSIS — I10 Essential (primary) hypertension: Secondary | ICD-10-CM | POA: Diagnosis not present

## 2018-01-22 DIAGNOSIS — K429 Umbilical hernia without obstruction or gangrene: Secondary | ICD-10-CM | POA: Diagnosis not present

## 2018-03-05 DIAGNOSIS — E041 Nontoxic single thyroid nodule: Secondary | ICD-10-CM | POA: Diagnosis not present

## 2018-03-05 DIAGNOSIS — I10 Essential (primary) hypertension: Secondary | ICD-10-CM | POA: Diagnosis not present

## 2018-03-05 DIAGNOSIS — E0500 Thyrotoxicosis with diffuse goiter without thyrotoxic crisis or storm: Secondary | ICD-10-CM | POA: Diagnosis not present

## 2018-04-17 DIAGNOSIS — H40023 Open angle with borderline findings, high risk, bilateral: Secondary | ICD-10-CM | POA: Diagnosis not present

## 2018-04-26 DIAGNOSIS — H40023 Open angle with borderline findings, high risk, bilateral: Secondary | ICD-10-CM | POA: Diagnosis not present

## 2018-05-09 DIAGNOSIS — E860 Dehydration: Secondary | ICD-10-CM | POA: Diagnosis not present

## 2018-05-09 DIAGNOSIS — R197 Diarrhea, unspecified: Secondary | ICD-10-CM | POA: Diagnosis not present

## 2018-05-18 NOTE — Progress Notes (Signed)
Subjective:  Primary Physician:  Medicine, [facility]  Patient ID: Alejandro Parker, male    DOB: [birth year], 63 y.o.   MRN: [id number]  No chief complaint on file.   HPI: Alejandro Parker  is a 63 y.o. male  with CAD, hypertension and hyperlipidemia presents for his 6 month OV. Due to angina pectoris, had a abnormal nuclear stress test for which he underwent repeat coronary angiography on 08/30/2016 that showed widely patent LAD stent, but otherwise normal coronary arteries. Since being on aggressive medical management has not had any further angina symptoms.  He is here for 6 month office visit. Doing well without any complaints. No chest pain, shortness of breath, dizziness, lightheadedness, syncope, or symptoms of claudication. He continues to work 2 jobs, but is hoping to cut back soon. Continues to smoke 3 cigars daily. He is considering quitting soon.   Past Medical History:  Diagnosis Date  . Allergic rhinitis   . Corns and callosities 07/23/10  . Coronary artery disease   . Erectile dysfunction   . GERD (gastroesophageal reflux disease)   . 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 Hyperlipidemia 08/09/2001  . Hypertension     Past Surgical History:  Procedure Laterality Date  . CORONARY ANGIOPLASTY WITH STENT PLACEMENT  10/22/10  . CORONARY ANGIOPLASTY WITH STENT PLACEMENT    . LEFT HEART CATH AND CORONARY ANGIOGRAPHY N/A 08/30/2016   Procedure: Left Heart Cath and Coronary Angiography;  Surgeon: Yates Decamp, MD;  Location: [facility] INVASIVE CV LAB;  Service: Cardiovascular;  Laterality: N/A;    Social History   Socioeconomic History  . Marital status: Married    Spouse name: Not on file  . Number of children: 4  . Years of education: Not on file  . Highest education level: Not on file  Occupational History  . Occupation: Engineering geologist  Social Needs  . Financial resource strain: Not on file  . Food insecurity:    Worry: Not on file    Inability: Not on file  .  Transportation needs:    Medical: Not on file    Non-medical: Not on file  Tobacco Use  . Smoking status: Current Every Day Smoker    Types: Cigars  . Smokeless tobacco: Never Used  . Tobacco comment: 3 cigars daily  Substance and Sexual Activity  . Alcohol use: Yes    Comment: occ  . Drug use: No  . Sexual activity: Not on file  Lifestyle  . Physical activity:    Days per week: Not on file    Minutes per session: Not on file  . Stress: Not on file  Relationships  . Social connections:    Talks on phone: Not on file    Gets together: Not on file    Attends religious service: Not on file    Active member of club or organization: Not on file    Attends meetings of clubs or organizations: Not on file    Relationship status: Not on file  . Intimate partner violence:    Fear of current or ex partner: Not on file    Emotionally abused: Not on file    Physically abused: Not on file    Forced sexual activity: Not on file  Other Topics Concern  . Not on file  Social History Narrative   ** Merged History Encounter **       Married and lives with wife.     Current Outpatient Medications on File  Prior to Visit  Medication Sig Dispense Refill  . amLODipine (NORVASC) 5 MG tablet Take 5 mg by mouth daily.    Marland Kitchen. aspirin 81 MG tablet Take 81 mg by mouth daily.    Marland Kitchen. atorvastatin (LIPITOR) 40 MG tablet Take 40 mg by mouth daily.  1  . carvedilol (COREG) 3.125 MG tablet Take 3.125 mg by mouth 2 (two) times daily with a meal.    . Cholecalciferol (VITAMIN D-3) 1000 units CAPS Take 1,000 Units by mouth 2 (two) times daily.    Marland Kitchen. NITROSTAT 0.4 MG SL tablet Place 0.4 mg under the tongue every 5 (five) minutes as needed for chest pain. As directed if needed    . Omega-3 Fatty Acids (OMEGA-3 FISH OIL PO) Take 1,280 mg by mouth 2 (two) times daily.    Marland Kitchen. omeprazole (PRILOSEC) 20 MG capsule Take 1 capsule (20 mg total) by mouth daily. 30 capsule 0  . OVER THE COUNTER MEDICATION Take 1 tablet by  mouth 2 (two) times daily. Liver care    . ranolazine (RANEXA) 1000 MG SR tablet Take 1 tablet (1,000 mg total) by mouth 2 (two) times daily. 60 tablet 3  . valsartan-hydrochlorothiazide (DIOVAN-HCT) 320-25 MG tablet Take 1 tablet by mouth daily.  3  . albuterol (PROAIR HFA) 108 (90 BASE) MCG/ACT inhaler Inhale 2 puffs into the lungs every 6 (six) hours as needed. (Patient not taking: Reported on 05/21/2018) 1 Inhaler 1  . Aspirin-Salicylamide-Caffeine (BC HEADACHE POWDER PO) Take 1 packet by mouth daily as needed (headache).     . diclofenac (VOLTAREN) 50 MG EC tablet Take 50 mg by mouth 2 (two) times daily.    . isosorbide mononitrate (IMDUR) 60 MG 24 hr tablet Take 1 tablet (60 mg total) by mouth daily. (Patient not taking: Reported on 05/21/2018) 30 tablet 0  . Multiple Vitamin (STRESSTABS PO) Take 2 tablets by mouth 2 (two) times daily. 2 tablets every morning & 2 tablets every evening    . OVER THE COUNTER MEDICATION Take 1 tablet by mouth 2 (two) times daily. ALJ tablets (herbal supplement)     No current facility-administered medications on file prior to visit.      Review of Systems  Constitutional: Negative for malaise/fatigue and weight loss.  Respiratory: Negative for cough, hemoptysis and shortness of breath.   Cardiovascular: Negative for chest pain, palpitations, claudication and leg swelling.  Gastrointestinal: Positive for abdominal pain (occasional being followed by Dr. Randa EvensEdwards). Negative for blood in stool, constipation, heartburn and vomiting.  Genitourinary: Negative for dysuria.  Musculoskeletal: Negative for joint pain and myalgias.  Neurological: Negative for dizziness, focal weakness and headaches.  Endo/Heme/Allergies: Does not bruise/bleed easily.  Psychiatric/Behavioral: Negative for depression. The patient is not nervous/anxious.   All other systems reviewed and are negative.      Objective:  Blood pressure (!) 149/84, pulse (!) 50, height 6' 2.5" (1.892 m),  weight 240 lb 14.4 oz (109.3 kg), SpO2 98 %. Body mass index is 30.52 kg/m.  Physical Exam  Constitutional: He appears healthy. No distress.  Eyes: Conjunctivae are normal.  Neck: Neck supple. No JVD present.  Cardiovascular: Regular rhythm, normal heart sounds, intact distal pulses and normal pulses. Exam reveals no gallop.  No murmur heard. Pulses:      Carotid pulses are 2+ on the right side and 2+ on the left side.      Radial pulses are 2+ on the right side and 2+ on the left side.  Femoral pulses are 2+ on the right side and 2+ on the left side.      Popliteal pulses are 2+ on the right side and 2+ on the left side.       Dorsalis pedis pulses are 2+ on the right side and 2+ on the left side.       Posterior tibial pulses are 2+ on the right side and 2+ on the left side.  Pulmonary/Chest: Breath sounds normal. He exhibits no tenderness.  Abdominal: Soft. Bowel sounds are normal.  Musculoskeletal: Normal range of motion.        General: Edema (trace) present.  Neurological: He is alert and oriented to person, place, and time.  Skin: Skin is warm.    CARDIAC STUDIES:   Coronary angiogram 08/30/2016: No change in coronary anatomy since 7/272012. Mid LAD 90% to 0% with 3.5x28 mm Promus (Post dilatation with 3.91mm balloon).  Repeat heart catheterization done for a abnormal stress test due to chest pain on 11/16/10: Stent widely patent.  Lexiscan sestamibi stress test 08/12/2016: 1. Resting EKG demonstrates normal sinus rhythm.  Patient initially attempted treadmill exercise stress test in which he was able to exercise for 6:49 minutes and and achieved 8.30 METs, 79% of MPHR, stress test changed to Lexiscan due to chest pain and dyspnea and ST segment changes.  With exercise, patient developed 4 mm ST segment depression with T-wave inversion that persisted for 2 minutes into recovery.  Stress test was markedly abnormal and positive for myocardial ischemia. There were no EKG chagnes  with Lexiscan infusion. 2.  SPECT images demonstrate Medium perfusion abnormality of moderate intensity in the basal inferior, mid inferior and apical inferior myocardial wall(s) on the stress images. In addition, there is a small perfusion abnormality of moderate intensity in the apical lateral myocardial wall(s) on the stress images. The inferior wall defect appears to be soft tissue attenuation and the small apical lateral defect a small area of ischemia. Gated SPECT images reveal normal myocardial thickening and wall motion.  The left ventricular ejection fraction was calculated or visually estimated to be 69%.  This is an intermediate risk study, clinical correlation recommended in view of abnormal EKG at submaximal exercise and associated chest pain.  Echocardiogram 08/10/2016: Left ventricle cavity is normal in size. Mild concentric hypertrophy of the left ventricle. Normal global wall motion. Normal diastolic filling pattern. Calculated EF 63%. Left atrial cavity is mild to moderately dilated at 4.4 cm. Mild (Grade I) mitral regurgitation. Mild tricuspid regurgitation. No evidence of pulmonary hypertension.   Assessment & Recommendations:   1. Coronary artery disease of native artery of native heart with stable angina pectoris (HCC) Coronary angiogram 08/30/2016: No change in coronary anatomy since 7/272012. Mid LAD 90% to 0% with 3.5x28 mm Promus (Post dilatation with 3.78mm balloon).  Repeat heart catheterization done for a abnormal stress test due to chest pain on 11/16/10: Stent widely patent.  No symptoms of angina. On aggressive medical therapy and tolerating medications well. No acute changes noted to EKG. Has stable bradycardia that is asymptomatic.   2. Essential hypertension, benign Slightly elevated today, but generally well controlled. Encouraged to continue to monitor. He is found to be off of Imdur, which I have encouraged him to resume.   3. Pure hypercholesterolemia Well  controlled on Lipitor. Being managed by PCP.   4. Laboratory examination 10/24/2016: Total cholesterol 87, triglycerides 103, HDL 26, LDL 40.  Serum glucose 120 mg, albumin 3.1.  Creatinine 0.83.  TSH 0.008.  Free T4 markedly elevated at 7.77.  PSA normal  08/25/2016: Creatinine 0.7 for, potassium 3.9, calcium 10.5, BMP otherwise normal.  RBC 4.1, hemoglobin 11.1, hematocrit 35.1, CBC otherwise normal.  INR 1.0, prothrombin time 10.5.  5.Tobacco use disorder Continues to smoke 3 cigars daily. Discussed importance of complete smoking cessation. Patient is considering quitting; however, not interested in medications at this time. Will consider working on this and hopefully be completely quit by next follow up.  Recommendation: Patient is overall doing well without any symptoms of angina. No clinical evidence of heart failure. Encouraged him to make changes to his diet and start daily exercise. I will see him back in 6 months or sooner if problems.   Toniann Fail, FNP-C 05/21/2018, 9:18 AM [facility] [place] Office: [phone]

## 2018-05-21 ENCOUNTER — Encounter: Payer: Self-pay | Admitting: Cardiology

## 2018-05-21 ENCOUNTER — Ambulatory Visit (INDEPENDENT_AMBULATORY_CARE_PROVIDER_SITE_OTHER): Payer: BC Managed Care – PPO | Admitting: Cardiology

## 2018-05-21 VITALS — BP 149/84 | HR 50 | Ht 74.5 in | Wt 240.9 lb

## 2018-05-21 DIAGNOSIS — I25118 Atherosclerotic heart disease of native coronary artery with other forms of angina pectoris: Secondary | ICD-10-CM

## 2018-05-21 DIAGNOSIS — I10 Essential (primary) hypertension: Secondary | ICD-10-CM

## 2018-05-21 DIAGNOSIS — F17200 Nicotine dependence, unspecified, uncomplicated: Secondary | ICD-10-CM

## 2018-05-21 DIAGNOSIS — E7800 Pure hypercholesterolemia, unspecified: Secondary | ICD-10-CM

## 2018-05-21 DIAGNOSIS — Z0189 Encounter for other specified special examinations: Secondary | ICD-10-CM | POA: Insufficient documentation

## 2018-05-21 MED ORDER — ISOSORBIDE MONONITRATE ER 60 MG PO TB24: 60.0000 mg | ORAL_TABLET | Freq: Every day | ORAL | 0 refills | Status: DC

## 2018-06-11 ENCOUNTER — Other Ambulatory Visit: Payer: Self-pay | Admitting: Cardiology

## 2018-07-18 IMAGING — CT CT ABD-PELV W/ CM
1 of 3 series · 14 of 32 positions shown, 19 images · IV contrast (iopamidol)
Comparison: Abdominal ultrasound 10/14/2016

CLINICAL DATA: Umbilical pain for 4 months.

EXAM:
CT ABDOMEN AND PELVIS WITH CONTRAST
TECHNIQUE: Multidetector CT imaging of the abdomen and pelvis was performed
using the standard protocol following bolus administration of
intravenous contrast.
CONTRAST:  100mL NL1BAJ-DWW IOPAMIDOL (NL1BAJ-DWW) INJECTION 61%

[Series 2: abd/pelvis w/cm · axial · 0.84mm/px · z∈[+682,+1107]mm · 14 of 99 slices shown, 19 images]
[im 7/99  soft-tissue]
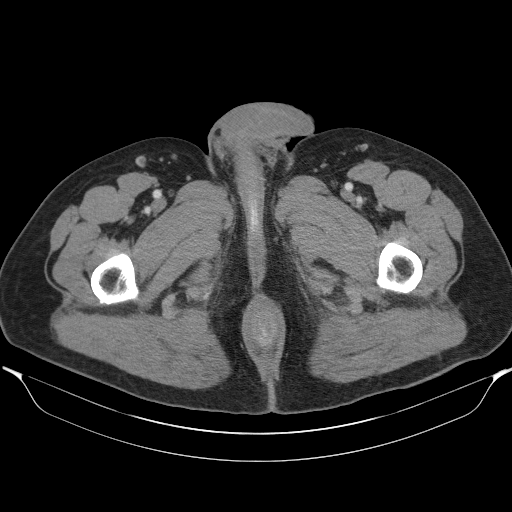
[im 7/99  bone]
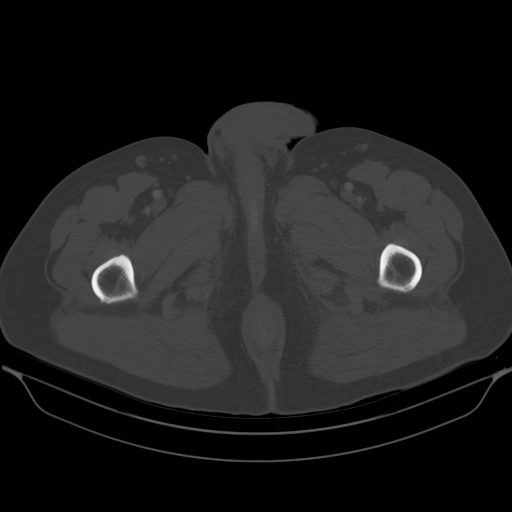
[im 13/99  soft-tissue]
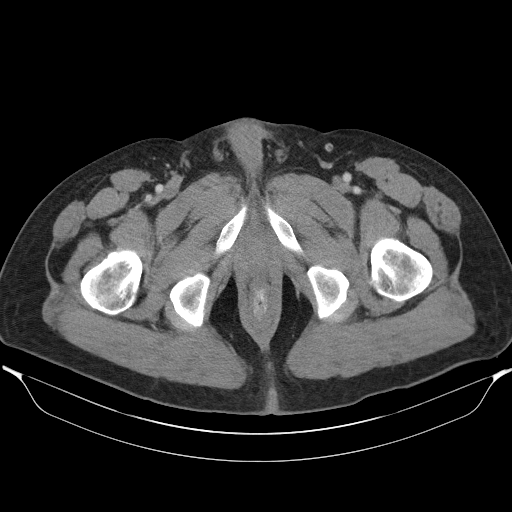
[im 19/99  soft-tissue]
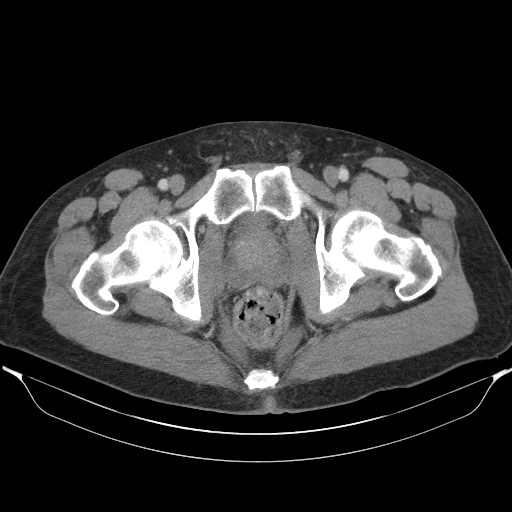
[im 31/99  soft-tissue]
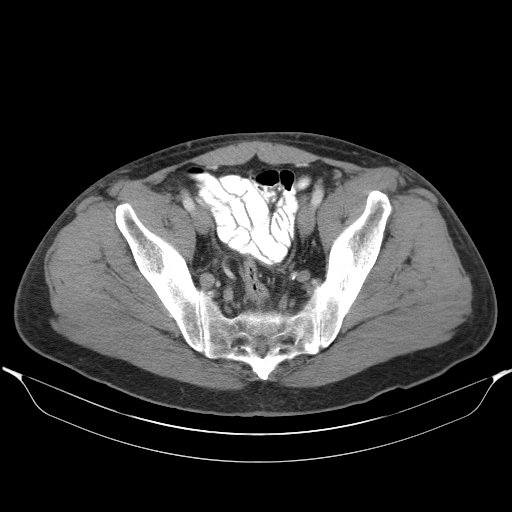
[im 37/99  soft-tissue]
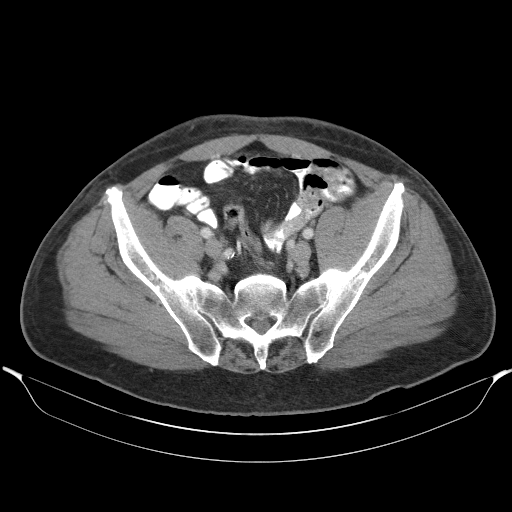
[im 43/99  soft-tissue]
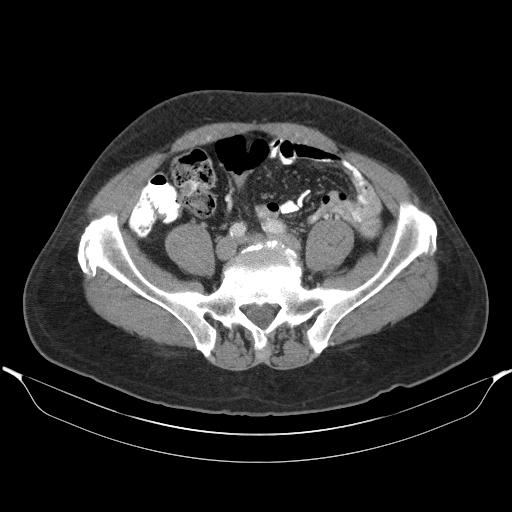
[im 50/99  soft-tissue]
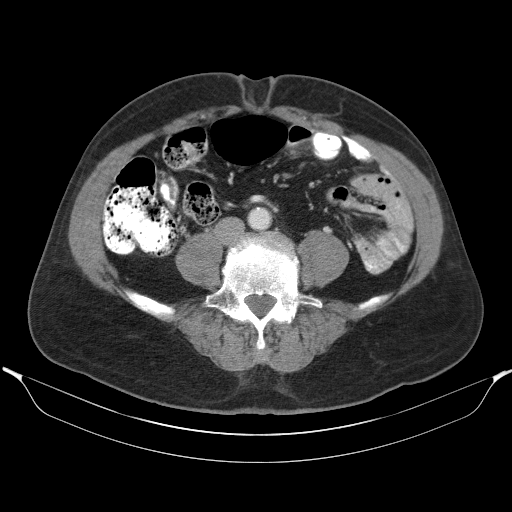
[im 56/99  soft-tissue]
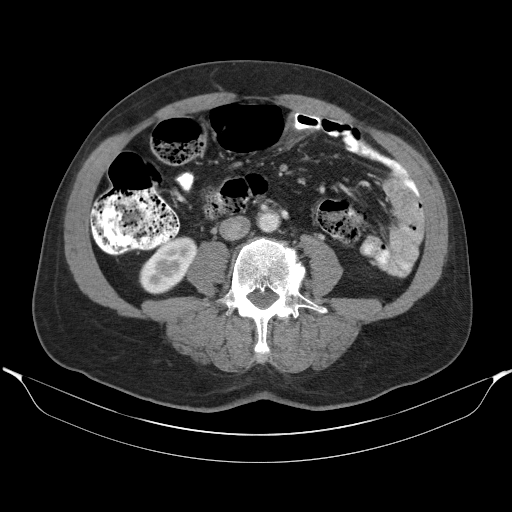
[im 62/99  soft-tissue]
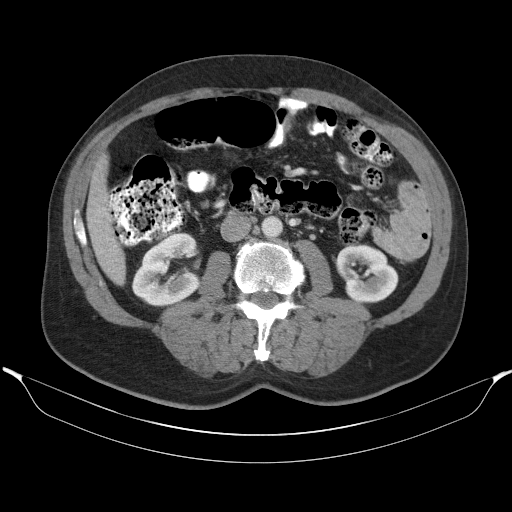
[im 62/99  bone]
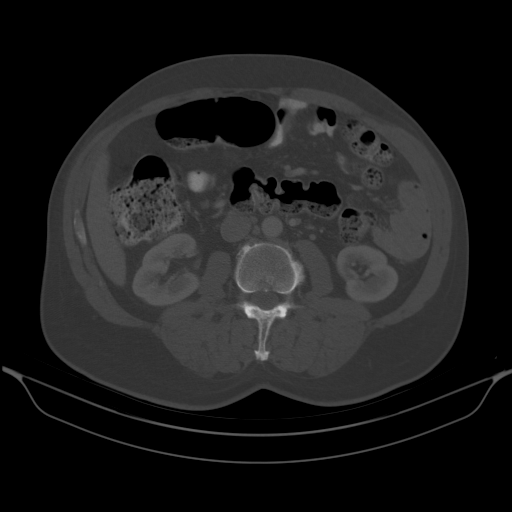
[im 68/99  soft-tissue]
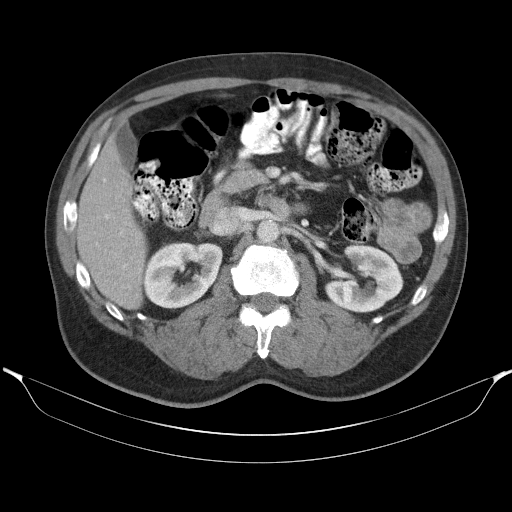
[im 74/99  lung]
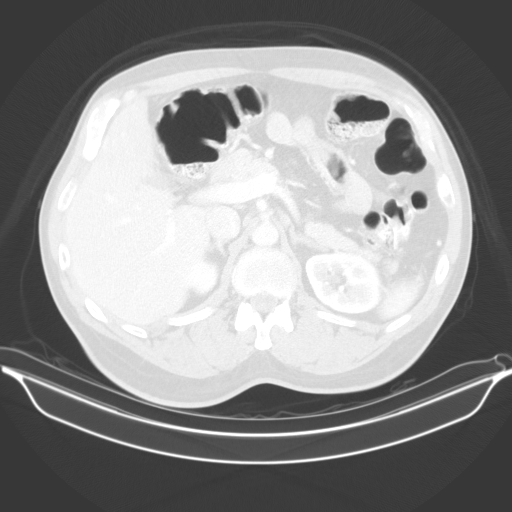
[im 80/99  soft-tissue]
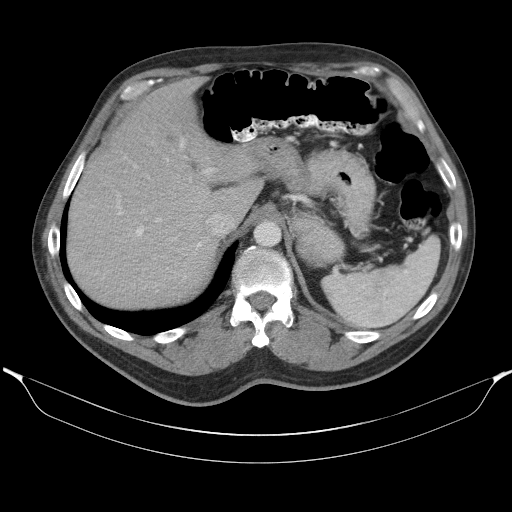
[im 80/99  lung]
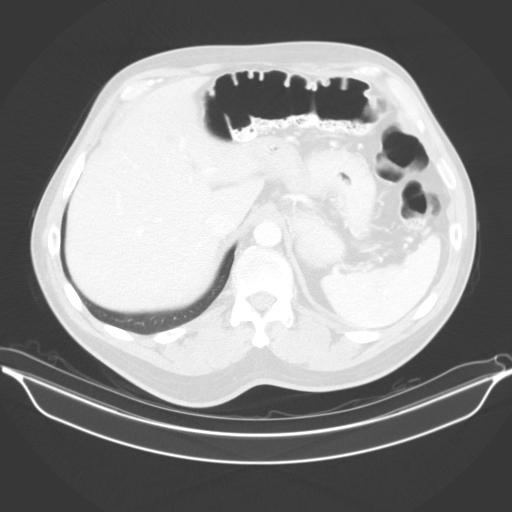
[im 86/99  soft-tissue]
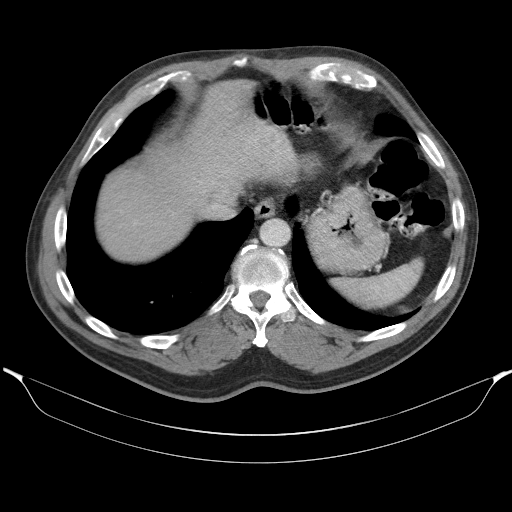
[im 86/99  lung]
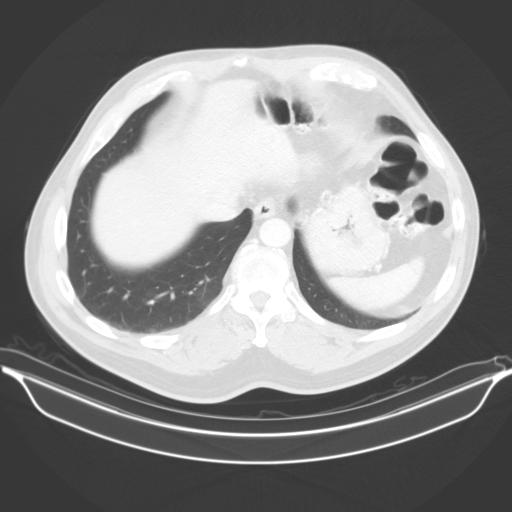
[im 92/99  soft-tissue]
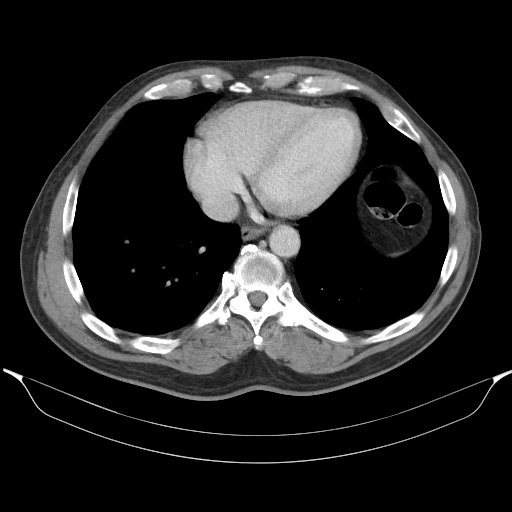
[im 92/99  lung]
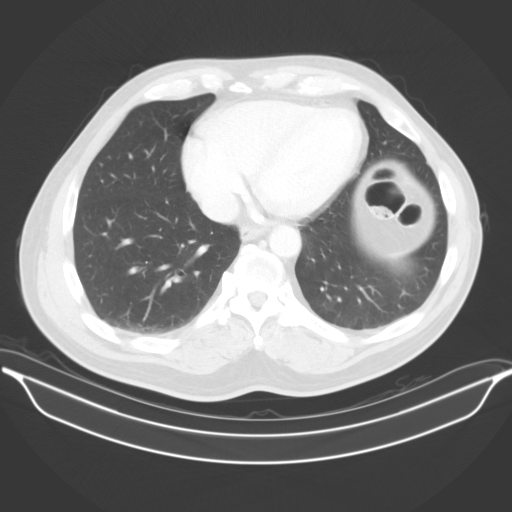

[14 of 32 positions shown; findings below may reference images not displayed]

FINDINGS: Lower chest: No acute abnormality.

Hepatobiliary: Numerous few mm hypoattenuated hepatic lesions, too
small to be actually characterized by CT. Normal decompressed
gallbladder.

Pancreas: Unremarkable. No pancreatic ductal dilatation or
surrounding inflammatory changes.

Spleen: Normal in size without focal abnormality.

Adrenals/Urinary Tract: Adrenal glands are unremarkable. Kidneys are
normal, without renal calculi, focal lesion, or hydronephrosis.
Bladder is unremarkable.

Stomach/Bowel: Diffuse nonspecific circumferential mucosal
thickening of the stomach. Normal appearance of the small bowel.
Normal appearance of the colon. Normal appendix.

Vascular/Lymphatic: Aortic atherosclerosis. No enlarged abdominal or
pelvic lymph nodes.

Reproductive: Enlarged heterogeneous prostate gland.

Other: Small fat containing periumbilical hernia.

Musculoskeletal: Multilevel osteoarthritic changes of the spine.
IMPRESSION: Numerous few mm too small to be accurately characterized
hypoattenuated lesions throughout the liver.

Circumferential mucosal thickening of the stomach. Most likely
caused includes gastritis. Infiltrative malignancy is possible,
although considered less likely.

Mildly enlarged heterogeneous prostate gland. Please correlate to
serum PSA values.

Small fat containing periumbilical hernia.

## 2018-09-04 DIAGNOSIS — E0500 Thyrotoxicosis with diffuse goiter without thyrotoxic crisis or storm: Secondary | ICD-10-CM | POA: Diagnosis not present

## 2018-09-11 ENCOUNTER — Ambulatory Visit (INDEPENDENT_AMBULATORY_CARE_PROVIDER_SITE_OTHER): Payer: BC Managed Care – PPO | Admitting: Cardiology

## 2018-09-11 ENCOUNTER — Encounter: Payer: Self-pay | Admitting: Cardiology

## 2018-09-11 ENCOUNTER — Other Ambulatory Visit: Payer: Self-pay

## 2018-09-11 VITALS — BP 138/77 | HR 53 | Temp 97.1°F | Ht 74.5 in | Wt 236.8 lb

## 2018-09-11 DIAGNOSIS — I10 Essential (primary) hypertension: Secondary | ICD-10-CM

## 2018-09-11 DIAGNOSIS — F17200 Nicotine dependence, unspecified, uncomplicated: Secondary | ICD-10-CM | POA: Diagnosis not present

## 2018-09-11 DIAGNOSIS — I25118 Atherosclerotic heart disease of native coronary artery with other forms of angina pectoris: Secondary | ICD-10-CM | POA: Diagnosis not present

## 2018-09-11 DIAGNOSIS — E7800 Pure hypercholesterolemia, unspecified: Secondary | ICD-10-CM | POA: Diagnosis not present

## 2018-09-11 MED ORDER — ATORVASTATIN CALCIUM 80 MG PO TABS: 80.0000 mg | ORAL_TABLET | Freq: Every day | ORAL | 3 refills | Status: DC

## 2018-09-11 NOTE — Progress Notes (Signed)
Primary Physician:  Efrain Sella, MD   Patient ID: Alejandro Parker, male    DOB: 09-Mar-1956, 63 y.o.   MRN: [id number]  Subjective:    Chief Complaint  Patient presents with   Coronary Artery Disease   Hypertension   Nicotine Dependence   Follow-up    23mo   HPI: Alejandro Parker is a 63y.o. male  with CAD, hypertension and hyperlipidemia presents for his 620 monthOV. Due to angina pectoris, had a abnormal nuclear stress test for which he underwent repeat coronary angiography on 08/30/2016 that showed widely patent LAD stent, but otherwise normal coronary arteries. Since being on aggressive medical management has not had any further angina symptoms.  He is here for 6 month office visit. Doing well without any complaints. No chest pain, shortness of breath, dizziness, lightheadedness, syncope, or symptoms of claudication. He continues to work 2 jobs. Continues to smoke 3 cigars daily. Reports that he is being laid off from his main job later this week and is concerned about that.   Past Medical History:  Diagnosis Date   Allergic rhinitis    Corns and callosities 07/23/10   Coronary artery disease    Erectile dysfunction    GERD (gastroesophageal reflux disease)    Herpes, genital 06/24/2004   Hyperlipidemia 08/09/2001   Hypertension     Past Surgical History:  Procedure Laterality Date   CORONARY ANGIOPLASTY WITH STENT PLACEMENT  10/22/10   CORONARY ANGIOPLASTY WITH STENT PLACEMENT     LEFT HEART CATH AND CORONARY ANGIOGRAPHY N/A 08/30/2016   Procedure: Left Heart Cath and Coronary Angiography;  Surgeon: GAdrian Prows MD;  [facility];  Service: Cardiovascular;  Laterality: N/A;    Social History   Socioeconomic History   Marital status: Married    Spouse name: Not on file   Number of children: 4   Years of education: Not on file   Highest education level: Not on file  Occupational History   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 Social Needs    Financial resource strain: Not on file   Food insecurity    Worry: Not on file    Inability: Not on file   Transportation needs    Medical: Not on file    Non-medical: Not on file  Tobacco Use   Smoking status: Current Every Day Smoker    Types: Cigars   Smokeless tobacco: Never Used   Tobacco comment: 3 cigars daily  Substance and Sexual Activity   Alcohol use: Yes    Comment: occ   Drug use: No   Sexual activity: Not on file  Lifestyle   Physical activity    Days per week: Not on file    Minutes per session: Not on file   Stress: Not on file  Relationships   Social connections    Talks on phone: Not on file    Gets together: Not on file    Attends religious service: Not on file    Active member of club or organization: Not on file    Attends meetings of clubs or organizations: Not on file    Relationship status: Not on file   Intimate partner violence    Fear of current or ex partner: Not on file    Emotionally abused: Not on file    Physically abused: Not on file    Forced sexual activity: Not on file  Other Topics Concern   Not on file  Social  History Narrative   ** Merged History Encounter **       Married and lives with wife.     Review of Systems  Constitution: Negative for decreased appetite, malaise/fatigue, weight gain and weight loss.  Eyes: Negative for visual disturbance.  Cardiovascular: Negative for chest pain, claudication, dyspnea on exertion, leg swelling, orthopnea, palpitations and syncope.  Respiratory: Negative for hemoptysis and wheezing.   Endocrine: Negative for cold intolerance and heat intolerance.  Hematologic/Lymphatic: Does not bruise/bleed easily.  Skin: Negative for nail changes.  Musculoskeletal: Negative for muscle weakness and myalgias.  Gastrointestinal: Negative for abdominal pain (occasional, followed by Dr. Oletta Lamas), change in bowel habit, nausea and vomiting.  Neurological: Negative for difficulty with  concentration, dizziness, focal weakness and headaches.  Psychiatric/Behavioral: Negative for altered mental status and suicidal ideas.  All other systems reviewed and are negative.     Objective:  Blood pressure 138/77, pulse (!) 53, temperature (!) 97.1 F (36.2 C), height 6' 2.5" (1.892 m), weight 236 lb 12.8 oz (107.4 kg), SpO2 97 %. Body mass index is 30 kg/m.    Physical Exam  Constitutional: He is oriented to person, place, and time. Vital signs are normal. He appears well-developed and well-nourished.  HENT:  Head: Normocephalic and atraumatic.  Neck: Normal range of motion.  Cardiovascular: Normal rate, regular rhythm, normal heart sounds and intact distal pulses.  Pulses:      Femoral pulses are 2+ on the right side and 2+ on the left side.      Popliteal pulses are 2+ on the right side and 2+ on the left side.       Dorsalis pedis pulses are 2+ on the right side and 2+ on the left side.       Posterior tibial pulses are 2+ on the right side and 2+ on the left side.  Trace leg edema  Pulmonary/Chest: Effort normal and breath sounds normal. No accessory muscle usage. No respiratory distress.  Abdominal: Soft. Bowel sounds are normal.  Musculoskeletal: Normal range of motion.  Neurological: He is alert and oriented to person, place, and time.  Skin: Skin is warm and dry.  Vitals reviewed.  Radiology: No results found.  Laboratory examination:   01/11/2018: Cholesterol 179, triglycerides 108, HDL 47, LDL 110. Na 145, eGFR 92, Creatinine 1.0, K 3.8, CMP normal.  CMP Latest Ref Rng & Units 09/27/2016 11/12/2012 10/23/2010  Glucose 65 - 99 mg/dL 120(H) 93 91  BUN 6 - 20 mg/dL '16 18 11  ' Creatinine 0.61 - 1.24 mg/dL 0.83 1.02 0.93  Sodium 135 - 145 mmol/L 140 140 140  Potassium 3.5 - 5.1 mmol/L 3.7 3.6 3.7  Chloride 101 - 111 mmol/L 107 106 107  CO2 22 - 32 mmol/L '27 25 24  ' Calcium 8.9 - 10.3 mg/dL 10.3 9.3 9.4  Total Protein 6.5 - 8.1 g/dL 5.5(L) - -  Total Bilirubin 0.3 -  1.2 mg/dL 0.5 - -  Alkaline Phos 38 - 126 U/L 72 - -  AST 15 - 41 U/L 37 - -  ALT 17 - 63 U/L 46 - -   CBC Latest Ref Rng & Units 09/27/2016 11/12/2012 10/23/2010  WBC 4.0 - 10.5 K/uL 5.1 5.3 6.7  Hemoglobin 13.0 - 17.0 g/dL 8.9(L) 12.6(L) 13.7  Hematocrit 39.0 - 52.0 % 27.7(L) 35.8(L) 40.6  Platelets 150 - 400 K/uL 131(L) 163 172   Lipid Panel     Component Value Date/Time   CHOL 87 09/27/2016 1328   TRIG 103  09/27/2016 1328   HDL 26 (L) 09/27/2016 1328   CHOLHDL 3.3 09/27/2016 1328   VLDL 21 09/27/2016 1328   LDLCALC 40 09/27/2016 1328   HEMOGLOBIN A1C No results found for: HGBA1C, MPG TSH No results for input(s): TSH in the last 8760 hours.  PRN Meds:. Medications Discontinued During This Encounter  Medication Reason   Multiple Vitamin (STRESSTABS PO) Patient Preference   Current Meds  Medication Sig   albuterol (PROAIR HFA) 108 (90 BASE) MCG/ACT inhaler Inhale 2 puffs into the lungs every 6 (six) hours as needed.   amLODipine (NORVASC) 5 MG tablet Take 5 mg by mouth daily.   aspirin 81 MG tablet Take 81 mg by mouth daily.   Aspirin-Salicylamide-Caffeine (BC HEADACHE POWDER PO) Take 1 packet by mouth daily as needed (headache).    atorvastatin (LIPITOR) 40 MG tablet Take 40 mg by mouth daily.   carvedilol (COREG) 3.125 MG tablet Take 3.125 mg by mouth 2 (two) times daily with a meal.   Cholecalciferol (VITAMIN D-3) 1000 units CAPS Take 1,000 Units by mouth 2 (two) times daily.   diclofenac (VOLTAREN) 50 MG EC tablet Take 50 mg by mouth 2 (two) times daily.   isosorbide mononitrate (IMDUR) 60 MG 24 hr tablet Take 1 tablet (60 mg total) by mouth daily.   NITROSTAT 0.4 MG SL tablet Place 0.4 mg under the tongue every 5 (five) minutes as needed for chest pain. As directed if needed   Omega-3 Fatty Acids (OMEGA-3 FISH OIL PO) Take 1,280 mg by mouth 2 (two) times daily.   omeprazole (PRILOSEC) 20 MG capsule Take 1 capsule (20 mg total) by mouth daily.   OVER THE  COUNTER MEDICATION Take 1 tablet by mouth 2 (two) times daily. Liver care   OVER THE COUNTER MEDICATION Take 1 tablet by mouth 2 (two) times daily. ALJ tablets (herbal supplement)   ranolazine (RANEXA) 1000 MG SR tablet Take 1 tablet (1,000 mg total) by mouth 2 (two) times daily.   valsartan-hydrochlorothiazide (DIOVAN-HCT) 320-25 MG tablet TAKE 1/2 TABLET BY MOUTH DAILY    Cardiac Studies:   Coronary angiogram 08/30/2016: No change in coronary anatomy since 7/272012. Mid LAD 90% to 0% with 3.5x28 mm Promus (Post dilatation with 3.30m balloon).  Repeat heart catheterization done for a abnormal stress test due to chest pain on 11/16/10: Stent widely patent.  Lexiscan sestamibi stress test 08/12/2016: 1. Resting EKG demonstrates normal sinus rhythm. Patient initially attempted treadmill exercise stress test in which he was able to exercise for 6:49 minutes and and achieved 8.30 METs, 79% of MPHR, stress test changed to Lexiscan due to chest pain and dyspnea and ST segment changes. With exercise, patient developed 4 mm ST segment depression with T-wave inversion that persisted for 2 minutes into recovery. Stress test was markedly abnormal and positive for myocardial ischemia. There were no EKG chagnes with Lexiscan infusion. 2. SPECT images demonstrate Medium perfusion abnormality of moderate intensity in the basal inferior, mid inferior and apical inferior myocardial wall(s) on the stress images. In addition, there is a small perfusion abnormality of moderate intensity in the apical lateral myocardial wall(s) on the stress images. The inferior wall defect appears to be soft tissue attenuation and the small apical lateral defect a small area of ischemia. Gated SPECT images reveal normal myocardial thickening and wall motion. The left ventricular ejection fraction was calculated or visually estimated to be 69%. This is an intermediate risk study, clinical correlation recommended in view of abnormal  EKG at submaximal  exercise and associated chest pain.  Echocardiogram 08/10/2016: Left ventricle cavity is normal in size. Mild concentric hypertrophy of the left ventricle. Normal global wall motion. Normal diastolic filling pattern. Calculated EF 63%. Left atrial cavity is mild to moderately dilated at 4.4 cm. Mild (Grade I) mitral regurgitation. Mild tricuspid regurgitation. No evidence of pulmonary hypertension.  Assessment:   1. Coronary artery disease of native artery of native heart with stable angina pectoris ([place])   2. Essential hypertension, benign   3. Pure hypercholesterolemia   4. Tobacco use disorder     EKG 05/21/2018: Sinus bradycardia at 51 bpm with first-degree AV block, normal axis, LVH, no evidence of ischemia.  Recommendations:   Patient is presently doing well, no symptoms of angina or clinical evidence of heart failure. Tolerating medications well. Blood pressure is well controlled. I have reviewed labs from PCP office, lipids are not at goal. I will further increase his lipitor up to 80 mg daily. Will recheck CMP and lipids before next office visit. He may also need addition of Zetia. I have encouraged him to start regular exercise and make diet modifications to help with this as well. I have again stressed the importance of complete smoking cessation, he is not interested in quitting at this time. I will see him back in 6 months or sooner if needed.   Miquel Dunn, MSN, APRN, FNP-C [facility]. [place] Office: [phone] Fax: [phone]

## 2018-09-11 NOTE — Patient Instructions (Signed)
Increase atorvastatin (lipitor) up to 80 mg daily due to high LDL  Can take 2 tablets of current prescription of 40 mg until finished, then can pick up new prescription that will be 80 mg.   Check lipids (fasting) and CMP in 6 months before next office visit

## 2018-09-12 ENCOUNTER — Encounter: Payer: Self-pay | Admitting: Cardiology

## 2018-11-19 ENCOUNTER — Ambulatory Visit: Payer: BLUE CROSS/BLUE SHIELD | Admitting: Cardiology

## 2018-11-19 DIAGNOSIS — I10 Essential (primary) hypertension: Secondary | ICD-10-CM | POA: Diagnosis not present

## 2018-11-19 DIAGNOSIS — R350 Frequency of micturition: Secondary | ICD-10-CM | POA: Diagnosis not present

## 2018-11-19 DIAGNOSIS — N401 Enlarged prostate with lower urinary tract symptoms: Secondary | ICD-10-CM | POA: Diagnosis not present

## 2018-11-19 DIAGNOSIS — R3915 Urgency of urination: Secondary | ICD-10-CM | POA: Diagnosis not present

## 2018-12-24 ENCOUNTER — Telehealth: Payer: Self-pay

## 2018-12-24 NOTE — Telephone Encounter (Signed)
LMTCB asking if he has used Nitro and is chest pain consistent or does cp come and go?

## 2018-12-24 NOTE — Telephone Encounter (Signed)
Persistently there or does it come and go. Any nitro use?

## 2018-12-24 NOTE — Telephone Encounter (Signed)
Patient complains of left sided chest pain/funny feeling x 4 days.  Nothing makes it better,nothing makes it worse.  Has not checked Bp, he is at work. Has taken medication today. No other symptoms, no nausea no swelling  Please advise

## 2018-12-26 NOTE — Telephone Encounter (Signed)
Patient has not had to use any nitro. Pain comes and goes.

## 2018-12-26 NOTE — Telephone Encounter (Signed)
Patient instructed to use nitro and schedule sooner appointment. Patient transferred to scheduling.

## 2018-12-26 NOTE — Telephone Encounter (Signed)
Can try using a nitroglycerin and see how he does. May need to move up his appt

## 2019-03-14 ENCOUNTER — Ambulatory Visit: Payer: BC Managed Care – PPO | Admitting: Cardiology

## 2019-04-16 ENCOUNTER — Other Ambulatory Visit: Payer: Self-pay

## 2019-04-16 ENCOUNTER — Encounter: Payer: Self-pay | Admitting: Cardiology

## 2019-04-16 ENCOUNTER — Ambulatory Visit (INDEPENDENT_AMBULATORY_CARE_PROVIDER_SITE_OTHER): Payer: BLUE CROSS/BLUE SHIELD | Admitting: Cardiology

## 2019-04-16 VITALS — BP 127/72 | HR 57 | Temp 97.9°F | Ht 74.0 in | Wt 240.6 lb

## 2019-04-16 DIAGNOSIS — F17200 Nicotine dependence, unspecified, uncomplicated: Secondary | ICD-10-CM | POA: Diagnosis not present

## 2019-04-16 DIAGNOSIS — I10 Essential (primary) hypertension: Secondary | ICD-10-CM

## 2019-04-16 DIAGNOSIS — I25118 Atherosclerotic heart disease of native coronary artery with other forms of angina pectoris: Secondary | ICD-10-CM | POA: Diagnosis not present

## 2019-04-16 DIAGNOSIS — E7800 Pure hypercholesterolemia, unspecified: Secondary | ICD-10-CM

## 2019-04-16 DIAGNOSIS — N529 Male erectile dysfunction, unspecified: Secondary | ICD-10-CM

## 2019-04-16 MED ORDER — SILDENAFIL CITRATE 100 MG PO TABS: 100.0000 mg | ORAL_TABLET | Freq: Every day | ORAL | 6 refills | Status: DC | PRN

## 2019-04-16 NOTE — Progress Notes (Signed)
Primary Physician/Referring:  Rosemary Holms, NP  Patient ID: Alejandro Parker, male    DOB: 1955/06/12, 64 y.o.   MRN: [id number]  Chief Complaint  Patient presents with  . Coronary Artery Disease  . Hypertension   HPI:    Alejandro Parker  is a 64 y.o. male  with CAD, hypertension, hyperglycemia and hyperlipidemia presents for his 6 month OV. Due to angina pectoris, had a abnormal nuclear stress test for which he underwent repeat coronary angiography on 08/30/2016 that showed widely patent LAD stent, but otherwise normal coronary arteries. Since being on aggressive medical management has not had any further angina symptoms.  He is here for 6 month office visit. Doing well without any complaints. No chest pain, shortness of breath, dizziness, lightheadedness, syncope, or symptoms of claudication. Continues to smoke 3 cigars daily.   Past Medical History:  Diagnosis Date  . Allergic rhinitis   . Corns and callosities 07/23/10  . Coronary artery disease   . Erectile dysfunction   . GERD (gastroesophageal reflux disease)   . 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 Hyperlipidemia 08/09/2001  . Hypertension    Past Surgical History:  Procedure Laterality Date  . CORONARY ANGIOPLASTY WITH STENT PLACEMENT  10/22/10  . CORONARY ANGIOPLASTY WITH STENT PLACEMENT    . LEFT HEART CATH AND CORONARY ANGIOGRAPHY N/A 08/30/2016   Procedure: Left Heart Cath and Coronary Angiography;  Surgeon: Alejandro Prows, MD;  Location: [facility];  Service: Cardiovascular;  Laterality: N/A;   Social History   Tobacco Use  . Smoking status: Current Every Day Smoker    Types: Cigars  . Smokeless tobacco: Never Used  . Tobacco comment: 3 cigars daily  Substance Use Topics  . Alcohol use: Yes    Comment: occ   ROS  Review of Systems  Constitution: Negative for weight gain.  Cardiovascular: Negative for dyspnea on exertion, leg swelling and syncope.  Respiratory: Negative for hemoptysis.   Endocrine: Negative for  cold intolerance.  Hematologic/Lymphatic: Does not bruise/bleed easily.  Gastrointestinal: Negative for hematochezia and melena.  Neurological: Negative for headaches and light-headedness.   Objective  Blood pressure 127/72, pulse (!) 57, temperature 97.9 F (36.6 C), height '6\' 2"'  (1.88 m), weight 240 lb 9.6 oz (109.1 kg), SpO2 98 %.  Vitals with BMI 04/16/2019 09/11/2018 05/21/2018  Height '6\' 2"'  6' 2.5" 6' 2.5"  Weight 240 lbs 10 oz 236 lbs 13 oz 240 lbs 14 oz  BMI 30.88 91.63 84.66  Systolic 599 357 017  Diastolic 72 77 84  Pulse 57 53 50     Physical Exam  Constitutional: He appears well-developed and well-nourished.  Neck: No thyromegaly present.  Cardiovascular: Normal rate, regular rhythm, normal heart sounds and intact distal pulses. Exam reveals no gallop.  No murmur heard. No leg edema, no JVD.  Pulmonary/Chest: Effort normal and breath sounds normal.  Abdominal: Soft. Bowel sounds are normal.  Musculoskeletal:     Cervical back: Neck supple.  Skin: Skin is warm and dry.   Laboratory examination:   No results for input(s): NA, K, CL, CO2, GLUCOSE, BUN, CREATININE, CALCIUM, GFRNONAA, GFRAA in the last 8760 hours. CrCl cannot be calculated (Patient's most recent lab result is older than the maximum 21 days allowed.).  CMP Latest Ref Rng & Units 09/27/2016 11/12/2012 10/23/2010  Glucose 65 - 99 mg/dL 120(H) 93 91  BUN 6 - 20 mg/dL '16 18 11  ' Creatinine 0.61 - 1.24 mg/dL 0.83 1.02 0.93  Sodium 135 -  145 mmol/L 140 140 140  Potassium 3.5 - 5.1 mmol/L 3.7 3.6 3.7  Chloride 101 - 111 mmol/L 107 106 107  CO2 22 - 32 mmol/L '27 25 24  ' Calcium 8.9 - 10.3 mg/dL 10.3 9.3 9.4  Total Protein 6.5 - 8.1 g/dL 5.5(L) - -  Total Bilirubin 0.3 - 1.2 mg/dL 0.5 - -  Alkaline Phos 38 - 126 U/L 72 - -  AST 15 - 41 U/L 37 - -  ALT 17 - 63 U/L 46 - -   CBC Latest Ref Rng & Units 09/27/2016 11/12/2012 10/23/2010  WBC 4.0 - 10.5 K/uL 5.1 5.3 6.7  Hemoglobin 13.0 - 17.0 g/dL 8.9(L) 12.6(L) 13.7   Hematocrit 39.0 - 52.0 % 27.7(L) 35.8(L) 40.6  Platelets 150 - 400 K/uL 131(L) 163 172   Lipid Panel     Component Value Date/Time   CHOL 87 09/27/2016 1328   TRIG 103 09/27/2016 1328   HDL 26 (L) 09/27/2016 1328   CHOLHDL 3.3 09/27/2016 1328   VLDL 21 09/27/2016 1328   LDLCALC 40 09/27/2016 1328   HEMOGLOBIN A1C No results found for: HGBA1C, MPG TSH No results for input(s): TSH in the last 8760 hours.  01/11/2018: Cholesterol 179, triglycerides 108, HDL 47, LDL 110. Na 145, eGFR 92, Creatinine 1.0, K 3.8, CMP normal.   Care Everywhere Result Report CBC And DifferentialResulted: 11/20/2018 3:35 AM [facility] Component Name Value Ref Range  WBC 6.3 3.4 - 10.8 x10E3/uL  RBC 4.77 4.14 - 5.8 x10E6/uL  Hemoglobin 14.0 13 - 17.7 g/dL  Hematocrit 41.7 37.5 - 51 %  MCV 87 79 - 97 fL  MCH 29.4 26.6 - 33 pg  MCHC 33.6 31.5 - 35.7 g/dL  RDW 13.9 11.6 - 15.4 %  Platelet Count 179    Care Everywhere Result Report Comprehensive metabolic panelResulted: 08/14/8020 3:35 AM [facility] Component Name Value Ref Range  Glucose 104 (H) 65 - 99 mg/dL  BUN 14 8 - 27 mg/dL  Creatinine, Serum 0.99 0.76 - 1.27 mg/dL  eGFR If NonAfrican American 81 >59 mL/min/1.73  eGFR If African American 93 >59 mL/min/1.73  BUN/Creatinine Ratio 14 10 - 24   Sodium 142 134 - 144 mmol/L  Potassium 3.7 3.5 - 5.2 mmol/L  Chloride 106 96 - 106 mmol/L  CO2 21 20 - 29 mmol/L  CALCIUM 9.4 8.6 - 10.2 mg/dL  Total Protein 6.7 6 - 8.5 g/dL  Albumin, Serum 4.2 3.8 - 4.8 g/dL  Globulin, Total 2.5 1.5 - 4.5 g/dL  Albumin/Globulin Ratio 1.7 1.2 - 2.2   Total Bilirubin 0.4 0 - 1.2 mg/dL  Alkaline Phosphatase 80 39 - 117 IU/L  AST 16 0 - 40 IU/L  ALT (SGPT) 12 0 - 44 IU/L  Specimen Collected on  Blood - Entire vein (body structure) 11/19/2018 2:41 PM   Care Everywhere Result Report TSH+Free T4Resulted: 09/05/2018 8:36 AM [facility] Component Name Value Ref Range  TSH 4.770 (H) 0.45 - 4.5 uIU/mL  Free T4  1.02    Medications and allergies   Allergies  Allergen Reactions  . Aleve [Naproxen] Hives  . Crestor [Rosuvastatin] Itching  . Latex Itching  . Other Rash    Synthetic rubber     Current Outpatient Medications  Medication Instructions  . amLODipine (NORVASC) 5 mg, Oral, Daily  . Aspirin-Salicylamide-Caffeine (BC HEADACHE POWDER PO) 1 packet, Oral, Daily PRN  . aspirin 81 mg, Oral, Daily  . atorvastatin (LIPITOR) 80 mg, Oral, Daily-1800  . carvedilol (COREG) 3.125  mg, Oral, 2 times daily with meals  . diclofenac (VOLTAREN) 50 mg, Oral, 2 times daily  . Nitrostat 0.4 mg, Sublingual, Every 5 min PRN, As directed if needed  . Omega-3 Fatty Acids (OMEGA-3 FISH OIL PO) 1,280 mg, Oral, Few times a week  . omeprazole (PRILOSEC) 20 mg, Oral, Daily  . OVER THE COUNTER MEDICATION 1 tablet, Oral, 2 times daily, Liver care  . OVER THE COUNTER MEDICATION 1 tablet, Oral, 2 times daily, ALJ tablets (herbal supplement)  . ranolazine (RANEXA) 1,000 mg, Oral, 2 times daily  . sildenafil (VIAGRA) 100 mg, Oral, Daily PRN  . valsartan-hydrochlorothiazide (DIOVAN-HCT) 320-25 MG tablet TAKE 1/2 TABLET BY MOUTH DAILY  . Vitamin D-3 1,000 Units, Oral, 2 times daily    Radiology:  No results found.  Cardiac Studies:   Lexiscan sestamibi stress test 08/12/2016: 1. Resting EKG demonstrates normal sinus rhythm.  Patient initially attempted treadmill exercise stress test in which he was able to exercise for 6:49 minutes and and achieved 8.30 METs, 79% of MPHR, stress test changed to Lexiscan due to chest pain and dyspnea and ST segment changes.  With exercise, patient developed 4 mm ST segment depression with T-wave inversion that persisted for 2 minutes into recovery.  Stress test was markedly abnormal and positive for myocardial ischemia. There were no EKG chagnes with Lexiscan infusion. 2.  SPECT images demonstrate Medium perfusion abnormality of moderate intensity in the basal inferior, mid inferior  and apical inferior myocardial wall(s) on the stress images. In addition, there is a small perfusion abnormality of moderate intensity in the apical lateral myocardial wall(s) on the stress images. The inferior wall defect appears to be soft tissue attenuation and the small apical lateral defect a small area of ischemia. Gated SPECT images reveal normal myocardial thickening and wall motion.  The left ventricular ejection fraction was calculated or visually estimated to be 69%.  This is an intermediate risk study, clinical correlation recommended in view of abnormal EKG at submaximal exercise and associated chest pain.   Echocardiogram 08/10/2016:  Left ventricle cavity is normal in size. Mild concentric hypertrophy of the left ventricle. Normal global wall motion. Normal diastolic filling pattern. Calculated EF 63%. Left atrial cavity is mild to moderately dilated at 4.4 cm. Mild (Grade I) mitral regurgitation. Mild tricuspid regurgitation. No evidence of pulmonary hypertension.  Coronary angiogram 08/30/2016:  No change in coronary anatomy since 7/272012. Mid LAD 90% to 0% with 3.5x28 mm Promus (Post dilatation with 3.54m balloon).  Repeat heart catheterization done for a abnormal stress test due to chest pain on 11/16/10: Stent widely patent.  Assessment     ICD-10-CM   1. Coronary artery disease of native artery of native heart with stable angina p[place]is [place]  I25.118 EKG 12-Lead  2. Essential hypertension, benign  I10   3. Pure hypercholesterolemia  E78.00 TSH+T4F+T3Free    Lipid Panel With LDL/HDL Ratio    LDL cholesterol, direct  4. Tobacco use disorder  F17.200   5. Vasculogenic erectile dysfunction, unspecified vasculogenic erectile dysfunction type  N52.9 sildenafil (VIAGRA) 100 MG tablet    EKG 04/16/2019: Normal sinus rhythm at rate of 56 bpm, normal axis, LVH with repolarization abnormality, cannot exclude inferior and lateral ischemia. No significant change from EKG 05/21/2018:  Sinus bradycardia at 51 bpm. T abnormality new.   Meds ordered this encounter  Medications  . sildenafil (VIAGRA) 100 MG tablet    Sig: Take 1 tablet (100 mg total) by mouth daily as needed for erectile  dysfunction.    Dispense:  15 tablet    Refill:  6    Medications Discontinued During This Encounter  Medication Reason  . albuterol (PROAIR HFA) 108 (90 BASE) MCG/ACT inhaler Error  . isosorbide mononitrate (IMDUR) 60 MG 24 hr tablet Discontinued by provider     Recommendations:   ARTHUR SPEAGLE  is a 64 y.o. AAM with CAD, hypertension, hyperglycemia and hyperlipidemia presents for his 6 month OV. Due to angina pectoris, had a abnormal nuclear stress test for which he underwent repeat coronary angiography on 08/30/2016 that showed widely patent LAD stent, but otherwise normal coronary arteries. Since being on aggressive medical management has not had any further angina symptoms.. Still smokes 3 cigars daily.   He is here on a six-month office visit and follow-up, he has not had any recurrence of angina pectoris, request Viagra prescription.  I have discontinued isosorbide mononitrate in view of interaction the blood pressure is well controlled.  I reviewed his lipids, last lipids from 2019.  We'll check labs again.  With regard to smoking again discussed at length regarding risk of progression of coronary artery disease and coronary spasm.  I'll see him back in 6 months or sooner if problems.  He has had abnormal TSH in the past and was on Synthroid.  I will also repeat TSH and T3-T4.  Alejandro Prows, MD, [facility] 04/16/2019, 3:45 PM [facility] [place] Office: [phone]

## 2019-05-31 DIAGNOSIS — E049 Nontoxic goiter, unspecified: Secondary | ICD-10-CM | POA: Diagnosis not present

## 2019-05-31 DIAGNOSIS — Z8639 Personal history of other endocrine, nutritional and metabolic disease: Secondary | ICD-10-CM | POA: Diagnosis not present

## 2019-05-31 DIAGNOSIS — E041 Nontoxic single thyroid nodule: Secondary | ICD-10-CM | POA: Diagnosis not present

## 2019-05-31 DIAGNOSIS — I10 Essential (primary) hypertension: Secondary | ICD-10-CM | POA: Diagnosis not present

## 2019-05-31 DIAGNOSIS — E0500 Thyrotoxicosis with diffuse goiter without thyrotoxic crisis or storm: Secondary | ICD-10-CM | POA: Diagnosis not present

## 2019-06-03 DIAGNOSIS — N401 Enlarged prostate with lower urinary tract symptoms: Secondary | ICD-10-CM | POA: Diagnosis not present

## 2019-06-03 DIAGNOSIS — E782 Mixed hyperlipidemia: Secondary | ICD-10-CM | POA: Diagnosis not present

## 2019-06-03 DIAGNOSIS — Z0000 Encounter for general adult medical examination without abnormal findings: Secondary | ICD-10-CM | POA: Diagnosis not present

## 2019-06-03 DIAGNOSIS — I10 Essential (primary) hypertension: Secondary | ICD-10-CM | POA: Diagnosis not present

## 2019-06-03 DIAGNOSIS — R350 Frequency of micturition: Secondary | ICD-10-CM | POA: Diagnosis not present

## 2019-07-03 DIAGNOSIS — I10 Essential (primary) hypertension: Secondary | ICD-10-CM | POA: Diagnosis not present

## 2019-07-03 DIAGNOSIS — K069 Disorder of gingiva and edentulous alveolar ridge, unspecified: Secondary | ICD-10-CM | POA: Diagnosis not present

## 2019-07-04 ENCOUNTER — Other Ambulatory Visit: Payer: Self-pay | Admitting: Cardiology

## 2019-08-01 DIAGNOSIS — E0500 Thyrotoxicosis with diffuse goiter without thyrotoxic crisis or storm: Secondary | ICD-10-CM | POA: Diagnosis not present

## 2019-08-06 DIAGNOSIS — E785 Hyperlipidemia, unspecified: Secondary | ICD-10-CM | POA: Diagnosis not present

## 2019-08-06 DIAGNOSIS — I10 Essential (primary) hypertension: Secondary | ICD-10-CM | POA: Diagnosis not present

## 2019-08-06 DIAGNOSIS — E0500 Thyrotoxicosis with diffuse goiter without thyrotoxic crisis or storm: Secondary | ICD-10-CM | POA: Diagnosis not present

## 2019-09-27 DIAGNOSIS — K090 Developmental odontogenic cysts: Secondary | ICD-10-CM | POA: Diagnosis not present

## 2019-10-09 ENCOUNTER — Other Ambulatory Visit: Payer: Self-pay | Admitting: Cardiology

## 2019-10-14 ENCOUNTER — Ambulatory Visit: Payer: BC Managed Care – PPO | Admitting: Cardiology

## 2020-04-21 DIAGNOSIS — E0500 Thyrotoxicosis with diffuse goiter without thyrotoxic crisis or storm: Secondary | ICD-10-CM | POA: Diagnosis not present

## 2020-05-19 DIAGNOSIS — R030 Elevated blood-pressure reading, without diagnosis of hypertension: Secondary | ICD-10-CM | POA: Diagnosis not present

## 2020-05-19 NOTE — Progress Notes (Deleted)
Primary Physician/Referring:  Rosemary Holms, NP  Patient ID: Alejandro Parker, male    DOB: 02-Nov-1955, 65 y.o.   MRN: [id number]  No chief complaint on file.  HPI:    Alejandro Parker  is a 65 y.o. male  with CAD, hypertension, hyperglycemia and hyperlipidemia presents for his 6 month OV. Due to angina pectoris, had a abnormal nuclear stress test for which he underwent repeat coronary angiography on 08/30/2016 that showed widely patent LAD stent, but otherwise normal coronary arteries. Since being on aggressive medical management has not had any further angina symptoms.  He is here for 6 month office visit. Doing well without any complaints. No chest pain, shortness of breath, dizziness, lightheadedness, syncope, or symptoms of claudication. Continues to smoke 3 cigars daily.   ***Patient presents for annual follow up of CAD, hypertension, hyperlipidemia. At last visit blood pressure was well controlled so isosorbide mononitrate was stopped and patient was prescribed Viagra.   ***Smoking? Thyroid? Get labs done?   Past Medical History:  Diagnosis Date  . Allergic rhinitis   . Corns and callosities 07/23/10  . Coronary artery disease   . Erectile dysfunction   . GERD (gastroesophageal reflux disease)   . 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 Hyperlipidemia 08/09/2001  . Hypertension    Past Surgical History:  Procedure Laterality Date  . CORONARY ANGIOPLASTY WITH STENT PLACEMENT  10/22/10  . CORONARY ANGIOPLASTY WITH STENT PLACEMENT    . LEFT HEART CATH AND CORONARY ANGIOGRAPHY N/A 08/30/2016   Procedure: Left Heart Cath and Coronary Angiography;  Surgeon: Adrian Prows, MD;  Location: [facility];  Service: Cardiovascular;  Laterality: N/A;   Social History   Tobacco Use  . Smoking status: Current Every Day Smoker    Types: Cigars  . Smokeless tobacco: Never Used  . Tobacco comment: 3 cigars daily  Substance Use Topics  . Alcohol use: Yes    Comment: occ   ROS  Review of Systems   Constitutional: Negative for weight gain.  Cardiovascular: Negative for dyspnea on exertion, leg swelling and syncope.  Respiratory: Negative for hemoptysis.   Endocrine: Negative for cold intolerance.  Hematologic/Lymphatic: Does not bruise/bleed easily.  Gastrointestinal: Negative for hematochezia and melena.  Neurological: Negative for headaches and light-headedness.   Objective  There were no vitals taken for this visit.  Vitals with BMI 04/16/2019 09/11/2018 05/21/2018  Height _0  6' 2.5" 6' 2.5"  Weight 240 lbs 10 oz 236 lbs 13 oz 240 lbs 14 oz  BMI 30.88 89.16 94.50  Systolic 388 828 003  Diastolic 72 77 84  Pulse 57 53 50     Physical Exam Constitutional:      Appearance: He is well-developed.  Neck:     Thyroid: No thyromegaly.  Cardiovascular:     Rate and Rhythm: Normal rate and regular rhythm.     Pulses: Intact distal pulses.     Heart sounds: Normal heart sounds. No murmur heard. No gallop.      Comments: No leg edema, no JVD. Pulmonary:     Effort: Pulmonary effort is normal.     Breath sounds: Normal breath sounds.  Abdominal:     General: Bowel sounds are normal.     Palpations: Abdomen is soft.  Musculoskeletal:     Cervical back: Neck supple.  Skin:    General: Skin is warm and dry.    Laboratory examination:   No results for input(s): NA, K, CL, CO2, GLUCOSE, BUN, CREATININE, CALCIUM,  GFRNONAA, GFRAA in the last 8760 hours. CrCl cannot be calculated (Patient's most recent lab result is older than the maximum 21 days allowed.).  CMP Latest Ref Rng & Units 09/27/2016 11/12/2012 10/23/2010  Glucose 65 - 99 mg/dL 120(H) 93 91  BUN 6 - 20 mg/dL _0 Creatinine 0.61 - 1.24 mg/dL 0.83 1.02 0.93  Sodium 135 - 145 mmol/L 140 140 140  Potassium 3.5 - 5.1 mmol/L 3.7 3.6 3.7  Chloride 101 - 111 mmol/L 107 106 107  CO2 22 - 32 mmol/L _1 Calcium 8.9 - 10.3 mg/dL 10.3 9.3 9.4  Total Protein 6.5 - 8.1 g/dL 5.5(L) - -  Total Bilirubin 0.3 - 1.2 mg/dL  0.5 - -  Alkaline Phos 38 - 126 U/L 72 - -  AST 15 - 41 U/L 37 - -  ALT 17 - 63 U/L 46 - -   CBC Latest Ref Rng & Units 09/27/2016 11/12/2012 10/23/2010  WBC 4.0 - 10.5 K/uL 5.1 5.3 6.7  Hemoglobin 13.0 - 17.0 g/dL 8.9(L) 12.6(L) 13.7  Hematocrit 39.0 - 52.0 % 27.7(L) 35.8(L) 40.6  Platelets 150 - 400 K/uL 131(L) 163 172   Lipid Panel     Component Value Date/Time   CHOL 87 09/27/2016 1328   TRIG 103 09/27/2016 1328   HDL 26 (L) 09/27/2016 1328   CHOLHDL 3.3 09/27/2016 1328   VLDL 21 09/27/2016 1328   LDLCALC 40 09/27/2016 1328   HEMOGLOBIN A1C No results found for: HGBA1C, MPG TSH No results for input(s): TSH in the last 8760 hours.   External Labs:  04/21/2020: TSH 6.08, free T4 0.95  01/17/2020:  Total cholesterol 172, triglycerides 135, HDL 46, LDL 102 Hemoglobin 14.8, hematocrit 45.0, MCV 91, platelet 185 Glucose 72, BUN 8, creatinine 0.8, GFR 81, sodium 141, potassium 3.6, AST 16, ALT 11, alkaline phosphatase 82 TSH 8.0, free T4 0.8  01/11/2018: Cholesterol 179, triglycerides 108, HDL 47, LDL 110. Na 145, eGFR 92, Creatinine 1.0, K 3.8, CMP normal.   Care Everywhere Result Report CBC And DifferentialResulted: 11/20/2018 3:35 AM [facility] Component Name Value Ref Range  WBC 6.3 3.4 - 10.8 x10E3/uL  RBC 4.77 4.14 - 5.8 x10E6/uL  Hemoglobin 14.0 13 - 17.7 g/dL  Hematocrit 41.7 37.5 - 51 %  MCV 87 79 - 97 fL  MCH 29.4 26.6 - 33 pg  MCHC 33.6 31.5 - 35.7 g/dL  RDW 13.9 11.6 - 15.4 %  Platelet Count 179    Care Everywhere Result Report Comprehensive metabolic panelResulted: 4/85/4627 3:35 AM [facility] Component Name Value Ref Range  Glucose 104 (H) 65 - 99 mg/dL  BUN 14 8 - 27 mg/dL  Creatinine, Serum 0.99 0.76 - 1.27 mg/dL  eGFR If NonAfrican American 81 >59 mL/min/1.73  eGFR If African American 93 >59 mL/min/1.73  BUN/Creatinine Ratio 14 10 - 24   Sodium 142 134 - 144 mmol/L  Potassium 3.7 3.5 - 5.2 mmol/L  Chloride 106 96 - 106 mmol/L  CO2 21 20  - 29 mmol/L  CALCIUM 9.4 8.6 - 10.2 mg/dL  Total Protein 6.7 6 - 8.5 g/dL  Albumin, Serum 4.2 3.8 - 4.8 g/dL  Globulin, Total 2.5 1.5 - 4.5 g/dL  Albumin/Globulin Ratio 1.7 1.2 - 2.2   Total Bilirubin 0.4 0 - 1.2 mg/dL  Alkaline Phosphatase 80 39 - 117 IU/L  AST 16 0 - 40 IU/L  ALT (SGPT) 12 0 - 44 IU/L  Specimen Collected on  Blood - Entire vein (  body structure) 11/19/2018 2:41 PM   Care Everywhere Result Report TSH+Free T4Resulted: 09/05/2018 8:36 AM [facility] Component Name Value Ref Range  TSH 4.770 (H) 0.45 - 4.5 uIU/mL  Free T4 1.02    Medications and allergies   Allergies  Allergen Reactions  . Aleve [Naproxen] Hives  . Crestor [Rosuvastatin] Itching  . Latex Itching  . Other Rash    Synthetic rubber     Current Outpatient Medications  Medication Instructions  . amLODipine (NORVASC) 5 mg, Oral, Daily  . Aspirin-Salicylamide-Caffeine (BC HEADACHE POWDER PO) 1 packet, Oral, Daily PRN  . aspirin 81 mg, Oral, Daily  . atorvastatin (LIPITOR) 80 mg, Oral, Daily-1800  . carvedilol (COREG) 3.125 mg, Oral, 2 times daily with meals  . diclofenac (VOLTAREN) 50 mg, Oral, 2 times daily  . Nitrostat 0.4 mg, Sublingual, Every 5 min PRN, As directed if needed  . Omega-3 Fatty Acids (OMEGA-3 FISH OIL PO) 1,280 mg, Oral, Few times a week  . omeprazole (PRILOSEC) 20 mg, Oral, Daily  . OVER THE COUNTER MEDICATION 1 tablet, Oral, 2 times daily, Liver care  . OVER THE COUNTER MEDICATION 1 tablet, Oral, 2 times daily, ALJ tablets (herbal supplement)  . ranolazine (RANEXA) 1,000 mg, Oral, 2 times daily  . sildenafil (VIAGRA) 100 mg, Oral, Daily PRN  . valsartan-hydrochlorothiazide (DIOVAN-HCT) 320-25 MG tablet TAKE 1/2 TABLET BY MOUTH EVERY DAY  . Vitamin D-3 1,000 Units, Oral, 2 times daily    Radiology:  No results found.  Cardiac Studies:   Lexiscan sestamibi stress test 08/12/2016: 1. Resting EKG demonstrates normal sinus rhythm.  Patient initially attempted treadmill  exercise stress test in which he was able to exercise for 6:49 minutes and and achieved 8.30 METs, 79% of MPHR, stress test changed to Lexiscan due to chest pain and dyspnea and ST segment changes.  With exercise, patient developed 4 mm ST segment depression with T-wave inversion that persisted for 2 minutes into recovery.  Stress test was markedly abnormal and positive for myocardial ischemia. There were no EKG chagnes with Lexiscan infusion. 2.  SPECT images demonstrate Medium perfusion abnormality of moderate intensity in the basal inferior, mid inferior and apical inferior myocardial wall(s) on the stress images. In addition, there is a small perfusion abnormality of moderate intensity in the apical lateral myocardial wall(s) on the stress images. The inferior wall defect appears to be soft tissue attenuation and the small apical lateral defect a small area of ischemia. Gated SPECT images reveal normal myocardial thickening and wall motion.  The left ventricular ejection fraction was calculated or visually estimated to be 69%.  This is an intermediate risk study, clinical correlation recommended in view of abnormal EKG at submaximal exercise and associated chest pain.   Echocardiogram 08/10/2016:  Left ventricle cavity is normal in size. Mild concentric hypertrophy of the left ventricle. Normal global wall motion. Normal diastolic filling pattern. Calculated EF 63%. Left atrial cavity is mild to moderately dilated at 4.4 cm. Mild (Grade I) mitral regurgitation. Mild tricuspid regurgitation. No evidence of pulmonary hypertension.  Coronary angiogram 08/30/2016:  No change in coronary anatomy since 7/272012. Mid LAD 90% to 0% with 3.5x28 mm Promus (Post dilatation with 3.57m balloon).  Repeat heart catheterization done for a abnormal stress test due to chest pain on 11/16/10: Stent widely patent.   EKG   ***  EKG 04/16/2019: Normal sinus rhythm at rate of 56 bpm, normal axis, LVH with  repolarization abnormality, cannot exclude inferior and lateral ischemia. No significant change  from EKG 05/21/2018: Sinus bradycardia at 51 bpm. T abnormality new.   Assessment     ICD-10-CM   1. 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 Essential hypertension, benign  I10   3. Pure hypercholesterolemia  E78.00   4. Tobacco use disorder  F17.200       No orders of the defined types were placed in this encounter.   There are no discontinued medications.   Recommendations:   Alejandro Parker  is a 65 y.o. AAM with CAD, hypertension, hyperglycemia and hyperlipidemia presents for his 6 month OV. Due to angina pectoris, had a abnormal nuclear stress test for which he underwent repeat coronary angiography on 08/30/2016 that showed widely patent LAD stent, but otherwise normal coronary arteries. Since being on aggressive medical management has not had any further angina symptoms.. Still smokes 3 cigars daily.   He is here on a six-month office visit and follow-up, he has not had any recurrence of angina pectoris, request Viagra prescription.  I have discontinued isosorbide mononitrate in view of interaction the blood pressure is well controlled.  I reviewed his lipids, last lipids from 2019.  We'll check labs again.  With regard to smoking again discussed at length regarding risk of progression of coronary artery disease and coronary spasm.  I'll see him back in 6 months or sooner if problems.  He has had abnormal TSH in the past and was on Synthroid.  I will also repeat TSH and T3-T4.  ***Patient presents for annual follow up of hypertension, hyperlipidemia, and CAD.   ***External labs - Lipids elevated, add zetia

## 2020-05-20 ENCOUNTER — Ambulatory Visit: Payer: BC Managed Care – PPO | Admitting: Student

## 2020-05-20 DIAGNOSIS — I25118 Atherosclerotic heart disease of native coronary artery with other forms of angina pectoris: Secondary | ICD-10-CM

## 2020-05-20 DIAGNOSIS — I10 Essential (primary) hypertension: Secondary | ICD-10-CM

## 2020-05-20 DIAGNOSIS — F17200 Nicotine dependence, unspecified, uncomplicated: Secondary | ICD-10-CM

## 2020-05-20 DIAGNOSIS — E7800 Pure hypercholesterolemia, unspecified: Secondary | ICD-10-CM

## 2020-05-27 NOTE — Progress Notes (Signed)
Primary Physician/Referring:  Rosemary Holms, NP  Patient ID: Alejandro Parker, male    DOB: [birth year], 65 y.o.   MRN: [id number]  Chief Complaint  Patient presents with   Coronary Artery Disease   Hyperlipidemia   Follow-up    6 MONTH   HPI:    Alejandro Parker  is a 65 y.o. male  with CAD, hypertension, hyperglycemia and hyperlipidemia presents for his 6 month OV. Due to angina pectoris, had a abnormal nuclear stress test for which he underwent repeat coronary angiography on 08/30/2016 that showed widely patent LAD stent, but otherwise normal coronary arteries. Since being on aggressive medical management has not had any further angina symptoms.  Patient presents for annual follow up of CAD, hypertension, hyperlipidemia. At last visit blood pressure was well controlled so isosorbide mononitrate was stopped and patient was prescribed Viagra.  Patient continues to do well, remains asymptomatic.  He has had no recurrence of anginal symptoms.  Denies dyspnea, palpitations, leg swelling, dizziness, syncope, near syncope.  Denies symptoms of claudication, PND, orthopnea.  Unfortunately he does continue to smoke approximately 3 cigars daily.  In regard to Graves' disease, patient is following closely with Dr. Hartford Poli in endocrinology.  Patient remains active on a daily basis, working in a [employer].   Past Medical History:  Diagnosis Date   Allergic rhinitis    Corns and callosities 07/23/10   Coronary artery disease    Erectile dysfunction    GERD (gastroesophageal reflux disease)    Herpes, genital 06/24/2004   Hyperlipidemia 08/09/2001   Hypertension    Past Surgical History:  Procedure Laterality Date   CORONARY ANGIOPLASTY WITH STENT PLACEMENT  10/22/10   CORONARY ANGIOPLASTY WITH STENT PLACEMENT     LEFT HEART CATH AND CORONARY ANGIOGRAPHY N/A 08/30/2016   Procedure: Left Heart Cath and Coronary Angiography;  Surgeon: Adrian Prows, MD;  Location: [facility];  Service:  Cardiovascular;  Laterality: N/A;   Social History   Tobacco Use   Smoking status: Current Every Day Smoker    Types: Cigars   Smokeless tobacco: Never Used   Tobacco comment: 3 cigars daily  Substance Use Topics   Alcohol use: Yes    Comment: occ   ROS  Review of Systems  Constitutional: Negative for malaise/fatigue and weight gain.  Cardiovascular: Negative for chest pain, claudication, dyspnea on exertion, leg swelling, near-syncope, orthopnea, palpitations, paroxysmal nocturnal dyspnea and syncope.  Respiratory: Negative for hemoptysis and shortness of breath.   Endocrine: Negative for cold intolerance.  Hematologic/Lymphatic: Does not bruise/bleed easily.  Gastrointestinal: Negative for hematochezia and melena.  Neurological: Negative for dizziness, headaches, light-headedness and weakness.   Objective  Blood pressure 118/67, pulse (!) 58, temperature 98.5 F (36.9 C), temperature source Temporal, resp. rate 17, height _0  (1.88 m), weight 240 lb 12.8 oz (109.2 kg), SpO2 98 %.  Vitals with BMI 05/28/2020 04/16/2019 09/11/2018  Height _1  _2  6' 2.5"  Weight 240 lbs 13 oz 240 lbs 10 oz 236 lbs 13 oz  BMI 30.9 91.47 82.95  Systolic 621 308 657  Diastolic 67 72 77  Pulse 58 57 53     Physical Exam Vitals reviewed.  Constitutional:      Appearance: He is well-developed.  HENT:     Head: Normocephalic and atraumatic.  Neck:     Thyroid: No thyromegaly.  Cardiovascular:     Rate and Rhythm: Normal rate and regular rhythm.     Pulses: Intact distal  pulses.     Heart sounds: Normal heart sounds, S1 normal and S2 normal. No murmur heard. No gallop.      Comments: No leg edema, no JVD. Pulmonary:     Effort: Pulmonary effort is normal. No respiratory distress.     Breath sounds: Normal breath sounds. No wheezing, rhonchi or rales.  Abdominal:     General: Bowel sounds are normal.     Palpations: Abdomen is soft.  Musculoskeletal:     Cervical back: Neck supple.      Right lower leg: No edema.     Left lower leg: No edema.  Skin:    General: Skin is warm and dry.     Findings: Rash:   Neurological:     Mental Status: He is alert.    Laboratory examination:   No results for input(s): NA, K, CL, CO2, GLUCOSE, BUN, CREATININE, CALCIUM, GFRNONAA, GFRAA in the last 8760 hours. CrCl cannot be calculated (Patient's most recent lab result is older than the maximum 21 days allowed.).  CMP Latest Ref Rng & Units 09/27/2016 11/12/2012 10/23/2010  Glucose 65 - 99 mg/dL 120(H) 93 91  BUN 6 - 20 mg/dL _0 Creatinine 0.61 - 1.24 mg/dL 0.83 1.02 0.93  Sodium 135 - 145 mmol/L 140 140 140  Potassium 3.5 - 5.1 mmol/L 3.7 3.6 3.7  Chloride 101 - 111 mmol/L 107 106 107  CO2 22 - 32 mmol/L _1 Calcium 8.9 - 10.3 mg/dL 10.3 9.3 9.4  Total Protein 6.5 - 8.1 g/dL 5.5(L) - -  Total Bilirubin 0.3 - 1.2 mg/dL 0.5 - -  Alkaline Phos 38 - 126 U/L 72 - -  AST 15 - 41 U/L 37 - -  ALT 17 - 63 U/L 46 - -   CBC Latest Ref Rng & Units 09/27/2016 11/12/2012 10/23/2010  WBC 4.0 - 10.5 K/uL 5.1 5.3 6.7  Hemoglobin 13.0 - 17.0 g/dL 8.9(L) 12.6(L) 13.7  Hematocrit 39.0 - 52.0 % 27.7(L) 35.8(L) 40.6  Platelets 150 - 400 K/uL 131(L) 163 172   Lipid Panel     Component Value Date/Time   CHOL 87 09/27/2016 1328   TRIG 103 09/27/2016 1328   HDL 26 (L) 09/27/2016 1328   CHOLHDL 3.3 09/27/2016 1328   VLDL 21 09/27/2016 1328   LDLCALC 40 09/27/2016 1328   HEMOGLOBIN A1C No results found for: HGBA1C, MPG TSH No results for input(s): TSH in the last 8760 hours.   External Labs:  04/21/2020: TSH 6.08, free T4 0.95  01/17/2020:  Total cholesterol 172, triglycerides 135, HDL 46, LDL 102 Hemoglobin 14.8, hematocrit 45.0, MCV 91, platelet 185 Glucose 72, BUN 8, creatinine 0.8, GFR 81, sodium 141, potassium 3.6, AST 16, ALT 11, alkaline phosphatase 82 TSH 8.0, free T4 0.8  01/11/2018: Cholesterol 179, triglycerides 108, HDL 47, LDL 110. Na 145, eGFR 92, Creatinine  1.0, K 3.8, CMP normal.   Care Everywhere Result Report CBC And DifferentialResulted: 11/20/2018 3:35 AM [facility] Component Name Value Ref Range  WBC 6.3 3.4 - 10.8 x10E3/uL  RBC 4.77 4.14 - 5.8 x10E6/uL  Hemoglobin 14.0 13 - 17.7 g/dL  Hematocrit 41.7 37.5 - 51 %  MCV 87 79 - 97 fL  MCH 29.4 26.6 - 33 pg  MCHC 33.6 31.5 - 35.7 g/dL  RDW 13.9 11.6 - 15.4 %  Platelet Count 179    Care Everywhere Result Report Comprehensive metabolic panelResulted: 2/87/8676 3:35 AM [facility] Component Name Value Ref Range  Glucose 104 (H) 65 - 99 mg/dL  BUN 14 8 - 27 mg/dL  Creatinine, Serum 0.99 0.76 - 1.27 mg/dL  eGFR If NonAfrican American 81 >59 mL/min/1.73  eGFR If African American 93 >59 mL/min/1.73  BUN/Creatinine Ratio 14 10 - 24   Sodium 142 134 - 144 mmol/L  Potassium 3.7 3.5 - 5.2 mmol/L  Chloride 106 96 - 106 mmol/L  CO2 21 20 - 29 mmol/L  CALCIUM 9.4 8.6 - 10.2 mg/dL  Total Protein 6.7 6 - 8.5 g/dL  Albumin, Serum 4.2 3.8 - 4.8 g/dL  Globulin, Total 2.5 1.5 - 4.5 g/dL  Albumin/Globulin Ratio 1.7 1.2 - 2.2   Total Bilirubin 0.4 0 - 1.2 mg/dL  Alkaline Phosphatase 80 39 - 117 IU/L  AST 16 0 - 40 IU/L  ALT (SGPT) 12 0 - 44 IU/L  Specimen Collected on  Blood - Entire vein (body structure) 11/19/2018 2:41 PM   Care Everywhere Result Report TSH+Free T4Resulted: 09/05/2018 8:36 AM [facility] Component Name Value Ref Range  TSH 4.770 (H) 0.45 - 4.5 uIU/mL  Free T4 1.02    Medications and allergies   Allergies  Allergen Reactions   Aleve [Naproxen] Hives   Crestor [Rosuvastatin] Itching   Latex Itching   Other Rash    Synthetic rubber     Current Outpatient Medications  Medication Instructions   amLODipine (NORVASC) 5 mg, Oral, Daily   aspirin 81 mg, Oral, Daily   atorvastatin (LIPITOR) 80 mg, Oral, Daily-1800   carvedilol (COREG) 3.125 mg, Oral, 2 times daily with meals   diclofenac (VOLTAREN) 50 mg, Oral, 2 times daily   ezetimibe (ZETIA) 10  mg, Oral, Daily   Nitrostat 0.4 mg, Sublingual, Every 5 min PRN, As directed if needed   Omega-3 Fatty Acids (OMEGA-3 FISH OIL PO) 1,280 mg, Oral, Few times a week   omeprazole (PRILOSEC) 20 mg, Oral, Daily   OVER THE COUNTER MEDICATION 1 tablet, Oral, 2 times daily, Liver care    OVER THE COUNTER MEDICATION 1 tablet, Oral, 2 times daily, ALJ tablets (herbal supplement)    valsartan-hydrochlorothiazide (DIOVAN-HCT) 320-25 MG tablet TAKE 1/2 TABLET BY MOUTH EVERY DAY   Vitamin D-3 1,000 Units, Oral, 2 times daily    Radiology:  No results found.  Cardiac Studies:   Lexiscan sestamibi stress test 08/12/2016: 1. Resting EKG demonstrates normal sinus rhythm.  Patient initially attempted treadmill exercise stress test in which he was able to exercise for 6:49 minutes and and achieved 8.30 METs, 79% of MPHR, stress test changed to Lexiscan due to chest pain and dyspnea and ST segment changes.  With exercise, patient developed 4 mm ST segment depression with T-wave inversion that persisted for 2 minutes into recovery.  Stress test was markedly abnormal and positive for myocardial ischemia. There were no EKG chagnes with Lexiscan infusion. 2.  SPECT images demonstrate Medium perfusion abnormality of moderate intensity in the basal inferior, mid inferior and apical inferior myocardial wall(s) on the stress images. In addition, there is a small perfusion abnormality of moderate intensity in the apical lateral myocardial wall(s) on the stress images. The inferior wall defect appears to be soft tissue attenuation and the small apical lateral defect a small area of ischemia. Gated SPECT images reveal normal myocardial thickening and wall motion.  The left ventricular ejection fraction was calculated or visually estimated to be 69%.  This is an intermediate risk study, clinical correlation recommended in view of abnormal EKG at submaximal exercise and associated chest pain.  Echocardiogram 08/10/2016:   Left ventricle cavity is normal in size. Mild concentric hypertrophy of the left ventricle. Normal global wall motion. Normal diastolic filling pattern. Calculated EF 63%. Left atrial cavity is mild to moderately dilated at 4.4 cm. Mild (Grade I) mitral regurgitation. Mild tricuspid regurgitation. No evidence of pulmonary hypertension.  Coronary angiogram 08/30/2016:  No change in coronary anatomy since 7/272012. Mid LAD 90% to 0% with 3.5x28 mm Promus (Post dilatation with 3.22m balloon).  Repeat heart catheterization done for a abnormal stress test due to chest pain on 11/16/10: Stent widely patent.   EKG   EKG 05/28/2020: Sinus bradycardia rate of 55 bpm.  Normal axis.  LVH.  Nonspecific T wave abnormality.  No evidence of ischemia or underlying injury pattern.  EKG 04/16/2019: Normal sinus rhythm at rate of 56 bpm, normal axis, LVH with repolarization abnormality, cannot exclude inferior and lateral ischemia. No significant change from EKG 05/21/2018: Sinus bradycardia at 51 bpm. T abnormality new.   Assessment     ICD-10-CM   1. Coronary artery disease of native artery of native heart with stable angina pectoris ([place]  I25.118   2. Essential hypertension, benign  I10 EKG 12-Lead  3. Pure hypercholesterolemia  E78.00 Lipid Panel With LDL/HDL Ratio    Lipid Panel With LDL/HDL Ratio  4. Tobacco use disorder  F17.200       Meds ordered this encounter  Medications   ezetimibe (ZETIA) 10 MG tablet    Sig: Take 1 tablet (10 mg total) by mouth daily.    Dispense:  30 tablet    Refill:  3    Medications Discontinued During This Encounter  Medication Reason   Aspirin-Salicylamide-Caffeine (BC HEADACHE POWDER PO) Error   ranolazine (RANEXA) 1000 MG SR tablet Patient has not taken in last 30 days   sildenafil (VIAGRA) 100 MG tablet Patient has not taken in last 30 days     Recommendations:   Alejandro Parker is a 65y.o. AAM with CAD, hypertension, hyperglycemia and  hyperlipidemia presents for his 6 month OV. Due to angina pectoris, had a abnormal nuclear stress test for which he underwent repeat coronary angiography on 08/30/2016 that showed widely patent LAD stent, but otherwise normal coronary arteries. Since being on aggressive medical management has not had any further angina symptoms.  Patient presents for annual follow up of hypertension, hyperlipidemia, and CAD.  He continues to do well, remains asymptomatic.  Patient states he has not used Viagra at all, he is also unclear as to whether or not he is taking isosorbide mononitrate.  Patient will call the office and update his medication list when he gets home.  Blood pressure is well controlled.  I personally reviewed external labs, lipids are elevated above goal.  We'll continue atorvastatin and add Zetia 10 mg daily.  We'll repeat lipid profile testing in 3 months.  Counseled patient Viagra and isosorbide mononitrate are not to be taken commendation, patient verbalized understanding and agreement.  Again discussed with patient regarding the importance of smoking cessation in order to reduce cardiovascular risk.  Encourage patient to continue to follow closely with PCP and endocrinology.  Follow-up in 6 months, sooner if needed, for hypertension, hyperlipidemia, CAD.   CAlethia Berthold PA-C 05/28/2020, 5:15 PM Office: [phone]

## 2020-05-28 ENCOUNTER — Other Ambulatory Visit: Payer: Self-pay

## 2020-05-28 ENCOUNTER — Encounter: Payer: Self-pay | Admitting: Student

## 2020-05-28 ENCOUNTER — Ambulatory Visit: Payer: BC Managed Care – PPO | Admitting: Student

## 2020-05-28 VITALS — BP 118/67 | HR 58 | Temp 98.5°F | Resp 17 | Ht 74.0 in | Wt 240.8 lb

## 2020-05-28 DIAGNOSIS — E7800 Pure hypercholesterolemia, unspecified: Secondary | ICD-10-CM | POA: Diagnosis not present

## 2020-05-28 DIAGNOSIS — I25118 Atherosclerotic heart disease of native coronary artery with other forms of angina pectoris: Secondary | ICD-10-CM

## 2020-05-28 DIAGNOSIS — F17200 Nicotine dependence, unspecified, uncomplicated: Secondary | ICD-10-CM

## 2020-05-28 DIAGNOSIS — I10 Essential (primary) hypertension: Secondary | ICD-10-CM

## 2020-05-28 DIAGNOSIS — F17210 Nicotine dependence, cigarettes, uncomplicated: Secondary | ICD-10-CM | POA: Diagnosis not present

## 2020-05-28 MED ORDER — EZETIMIBE 10 MG PO TABS: 10.0000 mg | ORAL_TABLET | Freq: Every day | ORAL | 3 refills | Status: DC

## 2020-05-28 NOTE — Patient Instructions (Signed)
Go to [facility] in 3 months to recheck cholesterol.

## 2020-07-14 DIAGNOSIS — E0500 Thyrotoxicosis with diffuse goiter without thyrotoxic crisis or storm: Secondary | ICD-10-CM | POA: Diagnosis not present

## 2020-08-08 ENCOUNTER — Other Ambulatory Visit: Payer: Self-pay | Admitting: Cardiology

## 2020-09-03 ENCOUNTER — Other Ambulatory Visit: Payer: Self-pay | Admitting: Student

## 2020-12-01 ENCOUNTER — Ambulatory Visit: Payer: BC Managed Care – PPO | Admitting: Student

## 2020-12-16 NOTE — Progress Notes (Signed)
Primary Physician/Referring:  Rosemary Holms, NP  Patient ID: Alejandro Parker, male    DOB: Jun 21, 1955, 65 y.o.   MRN: [id number]  Chief Complaint  Patient presents with   Coronary Artery Disease   Hypertension   HPI:    Alejandro Parker  is a 65 y.o. male  with CAD, hypertension, hyperglycemia and hyperlipidemia. Due to angina pectoris, had a abnormal nuclear stress test for which Alejandro Parker underwent repeat coronary angiography on 08/30/2016 that showed widely patent LAD stent, but otherwise normal coronary arteries. Since being on aggressive medical management has not had any further angina symptoms.  Patient presents for 37-monthfollow-up of hypertension and CAD.  At last office visit patient remained asymptomatic and cardiovascular risk factors were well controlled, therefore no changes were made.  Patient reports Alejandro Parker is feeling well overall.  Alejandro Parker has discontinued Imdur, and although Alejandro Parker has Viagra Alejandro Parker is not presently using it.  Alejandro Parker has not required use of sublingual nitroglycerin.  Unfortunately Alejandro Parker continues to smoke 2 to 2-1/2 cigars/day.   Denies chest pain, fatigue, dyspnea, palpitations, leg swelling, dizziness, syncope, near syncope.  Denies symptoms of claudication, PND, orthopnea. Patient remains active on a daily basis, working in a [employer]  Patient does admit to high dietary sodium intake.  In regard to Graves' disease, patient is following closely with Dr. LHartford Poliin endocrinology.    Past Medical History:  Diagnosis Date   Allergic rhinitis    Corns and callosities 07/23/10   Coronary artery disease    Erectile dysfunction    GERD (gastroesophageal reflux disease)    Herpes, genital 06/24/2004   Hyperlipidemia 08/09/2001   Hypertension    Family History  Problem Relation Age of Onset   Heart attack Father    Hypertension Father    Ulcers Father        stomach   Breast cancer Mother    Past Surgical History:  Procedure Laterality Date   CORONARY ANGIOPLASTY WITH STENT  PLACEMENT  10/22/10   CORONARY ANGIOPLASTY WITH STENT PLACEMENT     LEFT HEART CATH AND CORONARY ANGIOGRAPHY N/A 08/30/2016   Procedure: Left Heart Cath and Coronary Angiography;  Surgeon: GAdrian Prows MD;  [facility];  Service: Cardiovascular;  Laterality: N/A;   Social History   Tobacco Use   Smoking status: Every Day    Types: Cigars   Smokeless tobacco: Never   Tobacco comments:    3 cigars daily  Substance Use Topics   Alcohol use: Yes    Comment: occ   ROS  Review of Systems  Constitutional: Negative for malaise/fatigue and weight gain.  Cardiovascular:  Negative for chest pain, claudication, dyspnea on exertion, leg swelling, near-syncope, orthopnea, palpitations, paroxysmal nocturnal dyspnea and syncope.  Respiratory:  Negative for shortness of breath.   Neurological:  Negative for dizziness and light-headedness.   Objective  Blood pressure 132/72, pulse (!) 50, temperature 97.7 F (36.5 C), height '6\' 2"'  (1.88 m), weight 237 lb (107.5 kg), SpO2 95 %.  Vitals with BMI 12/18/2020 05/28/2020 04/16/2019  Height '6\' 2"'  '6\' 2"'  '6\' 2"'   Weight 237 lbs 240 lbs 13 oz 240 lbs 10 oz  BMI 30.42 [phone] Systolic [id number] Diastolic 72 67 72  Pulse 50 58 57     Physical Exam Vitals reviewed.  Constitutional:      Appearance: Alejandro Parker is well-developed.  HENT:     Head: Normocephalic and atraumatic.  Neck:  Thyroid: No thyromegaly.  Cardiovascular:     Rate and Rhythm: Normal rate and regular rhythm.     Pulses: Intact distal pulses.     Heart sounds: Normal heart sounds, S1 normal and S2 normal. No murmur heard.   No gallop.     Comments: No JVD. Pulmonary:     Effort: Pulmonary effort is normal. No respiratory distress.     Breath sounds: Normal breath sounds. No wheezing, rhonchi or rales.  Musculoskeletal:     Cervical back: Neck supple.     Right lower leg: No edema.     Left lower leg: No edema.  Skin:    General: Skin is warm and dry.     Findings:  Rash: .  Neurological:     Mental Status: Alejandro Parker is alert.   Heart rate while walking:  Start: 57 bpm  Half-way: 69 bpm  End: 7 bpm   Laboratory examination:   No results for input(s): NA, K, CL, CO2, GLUCOSE, BUN, CREATININE, CALCIUM, GFRNONAA, GFRAA in the last 8760 hours. CrCl cannot be calculated (Patient's most recent lab result is older than the maximum 21 days allowed.).  CMP Latest Ref Rng & Units 09/27/2016 11/12/2012 10/23/2010  Glucose 65 - 99 mg/dL 120(H) 93 91  BUN 6 - 20 mg/dL '16 18 11  ' Creatinine 0.61 - 1.24 mg/dL 0.83 1.02 0.93  Sodium 135 - 145 mmol/L 140 140 140  Potassium 3.5 - 5.1 mmol/L 3.7 3.6 3.7  Chloride 101 - 111 mmol/L 107 106 107  CO2 22 - 32 mmol/L '27 25 24  ' Calcium 8.9 - 10.3 mg/dL 10.3 9.3 9.4  Total Protein 6.5 - 8.1 g/dL 5.5(L) - -  Total Bilirubin 0.3 - 1.2 mg/dL 0.5 - -  Alkaline Phos 38 - 126 U/L 72 - -  AST 15 - 41 U/L 37 - -  ALT 17 - 63 U/L 46 - -   CBC Latest Ref Rng & Units 09/27/2016 11/12/2012 10/23/2010  WBC 4.0 - 10.5 K/uL 5.1 5.3 6.7  Hemoglobin 13.0 - 17.0 g/dL 8.9(L) 12.6(L) 13.7  Hematocrit 39.0 - 52.0 % 27.7(L) 35.8(L) 40.6  Platelets 150 - 400 K/uL 131(L) 163 172   Lipid Panel     Component Value Date/Time   CHOL 87 09/27/2016 1328   TRIG 103 09/27/2016 1328   HDL 26 (L) 09/27/2016 1328   CHOLHDL 3.3 09/27/2016 1328   VLDL 21 09/27/2016 1328   LDLCALC 40 09/27/2016 1328   HEMOGLOBIN A1C No results found for: HGBA1C, MPG TSH No results for input(s): TSH in the last 8760 hours.   External Labs:  12/14/2020: Hgb 14.3, HCT 41.8, MCV 87.3, platelet 184 BUN 14, creatinine 1.05, GFR >60, sodium 143, potassium 3.3 Total cholesterol 144, triglycerides 109, HDL 53, LDL 71 TSH 4.04  04/21/2020: TSH 6.08, free T4 0.95  01/17/2020:  Total cholesterol 172, triglycerides 135, HDL 46, LDL 102 Hemoglobin 14.8, hematocrit 45.0, MCV 91, platelet 185 Glucose 72, BUN 8, creatinine 0.8, GFR 81, sodium 141, potassium 3.6, AST 16, ALT 11,  alkaline phosphatase 82 TSH 8.0, free T4 0.8  01/11/2018: Cholesterol 179, triglycerides 108, HDL 47, LDL 110. Na 145, eGFR 92, Creatinine 1.0, K 3.8, CMP normal.   Care Everywhere Result Report CBC And DifferentialResulted: 11/20/2018 3:35 AM [facility] Component Name Value Ref Range  WBC 6.3 3.4 - 10.8 x10E3/uL  RBC 4.77 4.14 - 5.8 x10E6/uL  Hemoglobin 14.0 13 - 17.7 g/dL  Hematocrit 41.7 37.5 - 51 %  MCV  87 79 - 97 fL  MCH 29.4 26.6 - 33 pg  MCHC 33.6 31.5 - 35.7 g/dL  RDW 13.9 11.6 - 15.4 %  Platelet Count 179    Care Everywhere Result Report Comprehensive metabolic panelResulted: 9/79/4801 3:35 AM [facility] Component Name Value Ref Range  Glucose 104 (H) 65 - 99 mg/dL  BUN 14 8 - 27 mg/dL  Creatinine, Serum 0.99 0.76 - 1.27 mg/dL  eGFR If NonAfrican American 81 >59 mL/min/1.73  eGFR If African American 93 >59 mL/min/1.73  BUN/Creatinine Ratio 14 10 - 24   Sodium 142 134 - 144 mmol/L  Potassium 3.7 3.5 - 5.2 mmol/L  Chloride 106 96 - 106 mmol/L  CO2 21 20 - 29 mmol/L  CALCIUM 9.4 8.6 - 10.2 mg/dL  Total Protein 6.7 6 - 8.5 g/dL  Albumin, Serum 4.2 3.8 - 4.8 g/dL  Globulin, Total 2.5 1.5 - 4.5 g/dL  Albumin/Globulin Ratio 1.7 1.2 - 2.2   Total Bilirubin 0.4 0 - 1.2 mg/dL  Alkaline Phosphatase 80 39 - 117 IU/L  AST 16 0 - 40 IU/L  ALT (SGPT) 12 0 - 44 IU/L  Specimen Collected on  Blood - Entire vein (body structure) 11/19/2018 2:41 PM   Care Everywhere Result Report TSH+Free T4Resulted: 09/05/2018 8:36 AM [facility] Component Name Value Ref Range  TSH 4.770 (H) 0.45 - 4.5 uIU/mL  Free T4 1.02    Allergies   Allergies  Allergen Reactions   Aleve [Naproxen] Hives   Crestor [Rosuvastatin] Itching   Latex Itching   Other Rash    Synthetic rubber    Medications Prior to Visit:   Outpatient Medications Prior to Visit  Medication Sig Dispense Refill   amLODipine (NORVASC) 5 MG tablet Take by mouth.     aspirin 81 MG tablet Take 81 mg by mouth  daily.     atorvastatin (LIPITOR) 80 MG tablet TAKE 1 TABLET (80 MG TOTAL) BY MOUTH DAILY AT 6 PM. 90 tablet 3   carvedilol (COREG) 3.125 MG tablet Take 3.125 mg by mouth 2 (two) times daily with a meal.     Cholecalciferol (VITAMIN D-3) 1000 units CAPS Take 1,000 Units by mouth 2 (two) times daily.     diclofenac (VOLTAREN) 50 MG EC tablet Take 50 mg by mouth 2 (two) times daily.     ezetimibe (ZETIA) 10 MG tablet TAKE 1 TABLET BY MOUTH EVERY DAY 90 tablet 1   NITROSTAT 0.4 MG SL tablet Place 0.4 mg under the tongue every 5 (five) minutes as needed for chest pain. As directed if needed     omeprazole (PRILOSEC) 20 MG capsule Take 1 capsule (20 mg total) by mouth daily. 30 capsule 0   OVER THE COUNTER MEDICATION Take 1 tablet by mouth 2 (two) times daily. Liver care     OVER THE COUNTER MEDICATION Take 1 tablet by mouth 2 (two) times daily. ALJ tablets (herbal supplement)     valsartan-hydrochlorothiazide (DIOVAN-HCT) 320-25 MG tablet TAKE 1/2 TABLET BY MOUTH EVERY DAY 45 tablet 7   amLODipine (NORVASC) 5 MG tablet Take 5 mg by mouth daily.     Omega-3 Fatty Acids (OMEGA-3 FISH OIL PO) Take 1,280 mg by mouth. Few times a week     tamsulosin (FLOMAX) 0.4 MG CAPS capsule Take 0.4 mg by mouth daily.     No facility-administered medications prior to visit.   Final Medications at End of Visit    Current Meds  Medication Sig   amLODipine (NORVASC) 5  MG tablet Take by mouth.   aspirin 81 MG tablet Take 81 mg by mouth daily.   atorvastatin (LIPITOR) 80 MG tablet TAKE 1 TABLET (80 MG TOTAL) BY MOUTH DAILY AT 6 PM.   carvedilol (COREG) 3.125 MG tablet Take 3.125 mg by mouth 2 (two) times daily with a meal.   Cholecalciferol (VITAMIN D-3) 1000 units CAPS Take 1,000 Units by mouth 2 (two) times daily.   diclofenac (VOLTAREN) 50 MG EC tablet Take 50 mg by mouth 2 (two) times daily.   ezetimibe (ZETIA) 10 MG tablet TAKE 1 TABLET BY MOUTH EVERY DAY   NITROSTAT 0.4 MG SL tablet Place 0.4 mg under the  tongue every 5 (five) minutes as needed for chest pain. As directed if needed   omeprazole (PRILOSEC) 20 MG capsule Take 1 capsule (20 mg total) by mouth daily.   OVER THE COUNTER MEDICATION Take 1 tablet by mouth 2 (two) times daily. Liver care   OVER THE COUNTER MEDICATION Take 1 tablet by mouth 2 (two) times daily. ALJ tablets (herbal supplement)   valsartan-hydrochlorothiazide (DIOVAN-HCT) 320-25 MG tablet TAKE 1/2 TABLET BY MOUTH EVERY DAY   Radiology:  No results found.  Cardiac Studies:   Lexiscan sestamibi stress test 08/12/2016: 1. Resting EKG demonstrates normal sinus rhythm.  Patient initially attempted treadmill exercise stress test in which Alejandro Parker was able to exercise for 6:49 minutes and and achieved 8.30 METs, 79% of MPHR, stress test changed to Lexiscan due to chest pain and dyspnea and ST segment changes.  With exercise, patient developed 4 mm ST segment depression with T-wave inversion that persisted for 2 minutes into recovery.  Stress test was markedly abnormal and positive for myocardial ischemia. There were no EKG chagnes with Lexiscan infusion. 2.  SPECT images demonstrate Medium perfusion abnormality of moderate intensity in the basal inferior, mid inferior and apical inferior myocardial wall(s) on the stress images. In addition, there is a small perfusion abnormality of moderate intensity in the apical lateral myocardial wall(s) on the stress images. The inferior wall defect appears to be soft tissue attenuation and the small apical lateral defect a small area of ischemia. Gated SPECT images reveal normal myocardial thickening and wall motion.  The left ventricular ejection fraction was calculated or visually estimated to be 69%.  This is an intermediate risk study, clinical correlation recommended in view of abnormal EKG at submaximal exercise and associated chest pain.   Echocardiogram 08/10/2016:  Left ventricle cavity is normal in size. Mild concentric hypertrophy of the left  ventricle. Normal global wall motion. Normal diastolic filling pattern. Calculated EF 63%. Left atrial cavity is mild to moderately dilated at 4.4 cm. Mild (Grade I) mitral regurgitation. Mild tricuspid regurgitation. No evidence of pulmonary hypertension.  Coronary angiogram 08/30/2016:  No change in coronary anatomy since 7/272012. Mid LAD 90% to 0% with 3.5x28 mm Promus (Post dilatation with 3.51m balloon).  Repeat heart catheterization done for a abnormal stress test due to chest pain on 11/16/10: Stent widely patent.   EKG  12/18/2020: marked sinus bradycardia with first-degree AV block at a rate of 46 bpm.  Normal axis.  LVH, repolarization abnormality cannot exclude lateral ischemia.  Compared to EKG 05/28/2020, heart rate reduced from 55 bpm otherwise no significant change.  EKG 04/16/2019: Normal sinus rhythm at rate of 56 bpm, normal axis, LVH with repolarization abnormality, cannot exclude inferior and lateral ischemia. No significant change from  EKG 05/21/2018: Sinus bradycardia at 51 bpm. T abnormality new.   Assessment  ICD-10-CM   1. Coronary artery disease of native artery of native heart with stable angina pectoris ([place])  I25.118 EKG 12-Lead    2. Essential hypertension, benign  I10 EKG 12-Lead    3. Pure hypercholesterolemia  E78.00     4. Mild mitral regurgitation  I34.0 PCV ECHOCARDIOGRAM COMPLETE    5. Nicotine dependence, other tobacco product, uncomplicated  Q41.282       No orders of the defined types were placed in this encounter.   Medications Discontinued During This Encounter  Medication Reason   amLODipine (NORVASC) 5 MG tablet Duplicate   Omega-3 Fatty Acids (OMEGA-3 FISH OIL PO) Error   tamsulosin (FLOMAX) 0.4 MG CAPS capsule Error     Recommendations:   Alejandro Parker  is a 65 y.o. Alejandro Parker with CAD, hypertension, hyperglycemia and hyperlipidemia presents for his 6 month OV. Due to angina pectoris, had a abnormal nuclear stress test for which Alejandro Parker  underwent repeat coronary angiography on 08/30/2016 that showed widely patent LAD stent, but otherwise normal coronary arteries. Since being on aggressive medical management has not had any further angina symptoms.  Patient presents for 72-monthfollow-up of hypertension and CAD.  At last office visit patient remained asymptomatic and cardiovascular risk factors were well controlled, therefore no changes were made.  Patient remains asymptomatic from a cardiovascular standpoint.  Alejandro Parker is no longer taking care.  Patient's blood pressures mildly elevated in the office today, however suspect this is related to dietary sodium intake.  Discussed with patient at length regarding importance of DASH diet, patient seems motivated to make changes.  Also spent approximately 5 minutes counseling regarding tobacco cessation, however patient does not appear motivated to quit smoking cigars.  I personally reviewed external labs, lipids are well controlled.  Continue Zetia and atorvastatin.  Patient's EKG today revealed marked sinus bradycardia.  Patient has had history of bradycardia however Alejandro Parker remains asymptomatic.  Patient walked in the hallway during today's office visit and heart rate increased appropriately, demonstrating chronotropic competence.  Counseled patient to notify our office if Alejandro Parker experiences any signs or symptoms concerning for bradycardia, Alejandro Parker verbalized understanding agreement  We will continue present guideline directed medical therapy for CAD including aspirin, atorvastatin, carvedilol, and valsartan, as well as Zetia, amlodipine, and hydrochlorothiazide.  Will obtain repeat echocardiogram as Alejandro Parker has not had one done since 2018.  Patient is otherwise stable from a cardiovascular standpoint.  Follow-up in 6 months, sooner if needed, for CAD, hypertension, hyperlipidemia.   CAlethia Berthold PA-C 12/18/2020, 11:04 AM Office: [phone]

## 2020-12-18 ENCOUNTER — Other Ambulatory Visit: Payer: Self-pay

## 2020-12-18 ENCOUNTER — Ambulatory Visit: Payer: Commercial Managed Care - PPO | Admitting: Student

## 2020-12-18 ENCOUNTER — Other Ambulatory Visit: Payer: Self-pay | Admitting: Cardiology

## 2020-12-18 ENCOUNTER — Encounter: Payer: Self-pay | Admitting: Student

## 2020-12-18 VITALS — BP 132/72 | HR 50 | Temp 97.7°F | Ht 74.0 in | Wt 237.0 lb

## 2020-12-18 DIAGNOSIS — F17290 Nicotine dependence, other tobacco product, uncomplicated: Secondary | ICD-10-CM

## 2020-12-18 DIAGNOSIS — I10 Essential (primary) hypertension: Secondary | ICD-10-CM

## 2020-12-18 DIAGNOSIS — I25118 Atherosclerotic heart disease of native coronary artery with other forms of angina pectoris: Secondary | ICD-10-CM

## 2020-12-18 DIAGNOSIS — I340 Nonrheumatic mitral (valve) insufficiency: Secondary | ICD-10-CM

## 2020-12-18 DIAGNOSIS — E7800 Pure hypercholesterolemia, unspecified: Secondary | ICD-10-CM

## 2021-01-11 ENCOUNTER — Other Ambulatory Visit: Payer: Commercial Managed Care - PPO

## 2021-01-18 ENCOUNTER — Ambulatory Visit: Payer: Commercial Managed Care - PPO

## 2021-01-18 ENCOUNTER — Other Ambulatory Visit: Payer: Self-pay

## 2021-01-18 DIAGNOSIS — I340 Nonrheumatic mitral (valve) insufficiency: Secondary | ICD-10-CM

## 2021-01-23 NOTE — Progress Notes (Signed)
Essentially normal echo. Will discuss in detail at his next office visit.

## 2021-01-26 NOTE — Progress Notes (Signed)
Called and spoke with patient regarding his echocardiogram results.  ?

## 2021-04-18 ENCOUNTER — Other Ambulatory Visit: Payer: Self-pay | Admitting: Student

## 2021-06-17 DIAGNOSIS — Z87891 Personal history of nicotine dependence: Secondary | ICD-10-CM | POA: Diagnosis not present

## 2021-06-17 DIAGNOSIS — J0100 Acute maxillary sinusitis, unspecified: Secondary | ICD-10-CM | POA: Diagnosis not present

## 2021-06-17 DIAGNOSIS — Z122 Encounter for screening for malignant neoplasm of respiratory organs: Secondary | ICD-10-CM | POA: Diagnosis not present

## 2021-06-18 ENCOUNTER — Ambulatory Visit: Payer: BC Managed Care – PPO | Admitting: Student

## 2021-06-30 NOTE — Progress Notes (Signed)
? ?Primary Physician/Referring:  Cecile Sheerer, NP ? ?Patient ID: Alejandro Parker, male    DOB: [birth year], 66 y.o.   MRN: [id number] ? ?Chief Complaint  ?Patient presents with  ? coronary artery disease of native artery of native heart wi  ?  6 month  ? ?HPI:   ? ?Alejandro Parker  is a 66 y.o. male  with CAD, hypertension, hyperglycemia and hyperlipidemia. Due to angina pectoris, had a abnormal nuclear stress test for which he underwent repeat coronary angiography on 08/30/2016 that showed widely patent LAD stent, but otherwise normal coronary arteries. Since being on aggressive medical management has not had any further angina symptoms. ? ?Presents for 4-month follow-up.  Last office visit ordered echocardiogram which revealed normal LVEF and mild mitral regurgitation, otherwise essentially normal echo.  Patient is otherwise stable from a cardiovascular standpoint and no changes were made at last visit.  Patient continues to feel well without significant dyspnea.  Denies chest pain, orthopnea, PND.  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  Patient does admit to high dietary sodium intake.  He does unfortunately continue to smoke approximately 2 cigars/day. ? ?Patient will be due for repeat labs in September of this year, he reports he has upcoming physical with PCP, will defer lab testing to PCP. ? ?In regard to Graves' disease, patient is following closely with Dr. Shawnee Knapp in endocrinology.   ? ?Past Medical History:  ?Diagnosis Date  ? Allergic rhinitis   ? Corns and callosities 07/23/10  ? Coronary artery disease   ? Erectile dysfunction   ? GERD (gastroesophageal reflux disease)   ? Herpes, genital 06/24/2004  ? Hyperlipidemia 08/09/2001  ? Hypertension   ? ?Family History  ?Problem Relation Age of Onset  ? Heart attack Father   ? Hypertension Father   ? Ulcers Father   ?     stomach  ? Breast cancer Mother   ? ?Past Surgical History:  ?Procedure Laterality Date  ? CORONARY ANGIOPLASTY  WITH STENT PLACEMENT  10/22/10  ? CORONARY ANGIOPLASTY WITH STENT PLACEMENT    ? LEFT HEART CATH AND CORONARY ANGIOGRAPHY N/A 08/30/2016  ? Procedure: Left Heart Cath and Coronary Angiography;  Surgeon: Yates Decamp, MD;  Location: [facility];  Service: Cardiovascular;  Laterality: N/A;  ? ?Social History  ? ?Tobacco Use  ? Smoking status: Every Day  ?  Types: Cigars  ? Smokeless tobacco: Never  ? Tobacco comments:  ?  3 cigars daily  ?Substance Use Topics  ? Alcohol use: Yes  ?  Comment: occ  ? ?ROS  ?Review of Systems  ?Constitutional: Positive for weight gain. Negative for malaise/fatigue.  ?Cardiovascular:  Negative for chest pain, claudication, dyspnea on exertion, leg swelling, near-syncope, orthopnea, palpitations, paroxysmal nocturnal dyspnea and syncope.  ?Respiratory:  Negative for shortness of breath.   ?Neurological:  Negative for dizziness and light-headedness.  ? ?Objective  ?Blood pressure (!) 143/69, pulse (!) 50, temperature 98 ?F (36.7 ?C), temperature source Temporal, resp. rate 17, height 6\' 2"  (1.88 m), weight 242 lb (109.8 kg), SpO2 98 %.  ? ?  07/01/2021  ? 11:11 AM 07/01/2021  ? 10:47 AM 12/18/2020  ?  9:31 AM  ?Vitals with BMI  ?Height  6\' 2"  6\' 2"   ?Weight  242 lbs 237 lbs  ?BMI  31.06 30.42  ?Systolic 143 145 12/20/2020  ?Diastolic 69 74 72  ?Pulse 50 55 50  ?  ? Physical  Exam ?Vitals reviewed.  ?Constitutional:   ?   Appearance: He is well-developed.  ?Neck:  ?   Thyroid: No thyromegaly.  ?Cardiovascular:  ?   Rate and Rhythm: Regular rhythm. Bradycardia present.  ?   Pulses: Intact distal pulses.  ?   Heart sounds: Normal heart sounds, S1 normal and S2 normal. No murmur heard. ?  No gallop.  ?   Comments: No JVD. ?Pulmonary:  ?   Effort: Pulmonary effort is normal. No respiratory distress.  ?   Breath sounds: Normal breath sounds. No wheezing, rhonchi or rales.  ?Musculoskeletal:  ?   Cervical back: Neck supple.  ?   Right lower leg: No edema.  ?   Left lower leg: No edema.  ?Skin: ?   Findings:  Rash: .  ?Neurological:  ?   Mental Status: He is alert.  ? ?Heart rate while walking:  ?Start: 57 bpm  ?Half-way: 69 bpm  ?End: 7 bpm  ? ?Laboratory examination:  ? ?No results for input(s): NA, K, CL, CO2, GLUCOSE, BUN, CREATININE, CALCIUM, GFRNONAA, GFRAA in the last 8760 hours. ?CrCl cannot be calculated (Patient's most recent lab result is older than the maximum 21 days allowed.).  ? ?  Latest Ref Rng & Units 09/27/2016  ?  1:28 PM 11/12/2012  ?  1:41 PM 10/23/2010  ?  5:30 AM  ?CMP  ?Glucose 65 - 99 mg/dL 009   93   91    ?BUN 6 - 20 mg/dL 16   18   11     ?Creatinine 0.61 - 1.24 mg/dL   2.33   0.07    ?Sodium 135 - 145 mmol/L 140   140   140    ?Potassium 3.5 - 5.1 mmol/L 3.7   3.6   3.7    ?Chloride 101 - 111 mmol/L 107   106   107    ?CO2 22 - 32 mmol/L 27   25   24     ?Calcium 8.9 - 10.3 mg/dL 6.22   9.3   9.4    ?Total Protein 6.5 - 8.1 g/dL 5.5      ?Total Bilirubin 0.3 - 1.2 mg/dL 0.5      ?Alkaline Phos 38 - 126 U/L 72      ?AST 15 - 41 U/L 37      ?ALT 17 - 63 U/L 46      ? ? ?  Latest Ref Rng & Units 09/27/2016  ?  1:28 PM 11/12/2012  ?  1:41 PM 10/23/2010  ?  5:30 AM  ?CBC  ?WBC 4.0 - 10.5 K/uL 5.1   5.3   6.7    ?Hemoglobin 13.0 - 17.0 g/dL 8.9   11/14/2012   10/25/2010    ?Hematocrit 39.0 - 52.0 % 27.7   35.8   40.6    ?Platelets 150 - 400 K/uL 131   163   172    ? ?Lipid Panel  ?   ?Component Value Date/Time  ? CHOL 87 09/27/2016 1328  ? TRIG 103 09/27/2016 1328  ? HDL 26 (L) 09/27/2016 1328  ? CHOLHDL 3.3 09/27/2016 1328  ? VLDL 21 09/27/2016 1328  ? LDLCALC 40 09/27/2016 1328  ? ?HEMOGLOBIN A1C ?No results found for: HGBA1C, MPG ?TSH ?No results for input(s): TSH in the last 8760 hours. ? ? ?External Labs:  ?12/14/2020: ?Hgb 14.3, HCT 41.8, MCV 87.3, platelet 184 ?BUN 14, creatinine 1.05, GFR >60, sodium 143, potassium 3.3 ?Total cholesterol 144,  triglycerides 109, HDL 53, LDL 71 ?TSH 4.04 ? ?Allergies  ? ?Allergies  ?Allergen Reactions  ? Aleve [Naproxen] Hives  ? Crestor [Rosuvastatin] Itching  ? Latex  Itching  ? Other Rash  ?  Synthetic rubber  ?  ?Medications Prior to Visit:  ? ?Outpatient Medications Prior to Visit  ?Medication Sig Dispense Refill  ? amLODipine (NORVASC) 5 MG tablet Take by mouth.    ? aspirin 81 MG EC tablet Take 1 tablet by mouth daily.    ? atorvastatin (LIPITOR) 80 MG tablet TAKE 1 TABLET (80 MG TOTAL) BY MOUTH DAILY AT 6 PM. 90 tablet 3  ? carvedilol (COREG) 3.125 MG tablet Take 3.125 mg by mouth 2 (two) times daily with a meal.    ? Cholecalciferol (VITAMIN D-3) 1000 units CAPS Take 1,000 Units by mouth 2 (two) times daily.    ? diclofenac (VOLTAREN) 50 MG EC tablet Take 50 mg by mouth 2 (two) times daily.    ? ezetimibe (ZETIA) 10 MG tablet TAKE 1 TABLET BY MOUTH EVERY DAY 90 tablet 1  ? isosorbide mononitrate (IMDUR) 60 MG 24 hr tablet Take 60 mg by mouth daily.    ? methimazole (TAPAZOLE) 5 MG tablet Take 5 mg by mouth 3 (three) times a week.    ? NITROSTAT 0.4 MG SL tablet Place 0.4 mg under the tongue every 5 (five) minutes as needed for chest pain. As directed if needed    ? omeprazole (PRILOSEC) 20 MG capsule Take 1 capsule (20 mg total) by mouth daily. 30 capsule 0  ? ondansetron (ZOFRAN) 4 MG tablet Take 4 mg by mouth every 8 (eight) hours as needed for nausea or vomiting.    ? OVER THE COUNTER MEDICATION Take 1 tablet by mouth 2 (two) times daily. Liver care    ? OVER THE COUNTER MEDICATION Take 1 tablet by mouth 2 (two) times daily. ALJ tablets (herbal supplement)    ? valsartan-hydrochlorothiazide (DIOVAN-HCT) 320-25 MG tablet TAKE 1/2 TABLET BY MOUTH EVERY DAY 45 tablet 7  ? aspirin 81 MG tablet Take 81 mg by mouth daily.    ? ?No facility-administered medications prior to visit.  ? ?Final Medications at End of Visit   ? ?Current Meds  ?Medication Sig  ? amLODipine (NORVASC) 5 MG tablet Take by mouth.  ? aspirin 81 MG EC tablet Take 1 tablet by mouth daily.  ? atorvastatin (LIPITOR) 80 MG tablet TAKE 1 TABLET (80 MG TOTAL) BY MOUTH DAILY AT 6 PM.  ? carvedilol (COREG) 3.125  MG tablet Take 3.125 mg by mouth 2 (two) times daily with a meal.  ? Cholecalciferol (VITAMIN D-3) 1000 units CAPS Take 1,000 Units by mouth 2 (two) times daily.  ? diclofenac (VOLTAREN) 50 MG EC tablet [place]

## 2021-07-01 ENCOUNTER — Encounter: Payer: Self-pay | Admitting: Student

## 2021-07-01 ENCOUNTER — Ambulatory Visit: Payer: BC Managed Care – PPO | Admitting: Student

## 2021-07-01 VITALS — BP 143/69 | HR 50 | Temp 98.0°F | Resp 17 | Ht 74.0 in | Wt 242.0 lb

## 2021-07-01 DIAGNOSIS — I25118 Atherosclerotic heart disease of native coronary artery with other forms of angina pectoris: Secondary | ICD-10-CM | POA: Diagnosis not present

## 2021-07-01 DIAGNOSIS — E7800 Pure hypercholesterolemia, unspecified: Secondary | ICD-10-CM | POA: Diagnosis not present

## 2021-07-01 DIAGNOSIS — I10 Essential (primary) hypertension: Secondary | ICD-10-CM | POA: Diagnosis not present

## 2021-07-23 ENCOUNTER — Other Ambulatory Visit: Payer: Self-pay

## 2021-07-23 MED ORDER — CARVEDILOL 3.125 MG PO TABS: 3.1250 mg | ORAL_TABLET | Freq: Two times a day (BID) | ORAL | 2 refills | Status: DC

## 2021-08-02 ENCOUNTER — Telehealth: Payer: Self-pay

## 2021-08-02 NOTE — Telephone Encounter (Signed)
He may continue taking 6.25 mg. If he wants to use the other tablets he can double them up.

## 2021-08-02 NOTE — Telephone Encounter (Signed)
Pt is confused about how many mg of carvedilol he is suppose to take. His PCP has been sending in 6.25 while we have been sending the 3.125. He has been taking the 6.25 for years and is unsure of what to do. Please advise. ?

## 2021-08-04 ENCOUNTER — Other Ambulatory Visit: Payer: Self-pay

## 2021-08-04 MED ORDER — CARVEDILOL 6.25 MG PO TABS: 6.2500 mg | ORAL_TABLET | Freq: Two times a day (BID) | ORAL | 0 refills | Status: DC

## 2021-08-04 NOTE — Telephone Encounter (Signed)
Pt aware.

## 2021-08-09 ENCOUNTER — Ambulatory Visit: Payer: BC Managed Care – PPO | Admitting: Student

## 2021-08-31 ENCOUNTER — Other Ambulatory Visit: Payer: Self-pay | Admitting: Cardiology

## 2021-10-12 ENCOUNTER — Other Ambulatory Visit: Payer: Self-pay | Admitting: Student

## 2021-10-27 ENCOUNTER — Other Ambulatory Visit: Payer: Self-pay | Admitting: Student

## 2021-11-01 ENCOUNTER — Other Ambulatory Visit: Payer: Self-pay

## 2021-11-01 MED ORDER — CARVEDILOL 6.25 MG PO TABS: 6.2500 mg | ORAL_TABLET | Freq: Two times a day (BID) | ORAL | 2 refills | Status: DC

## 2021-12-30 ENCOUNTER — Encounter: Payer: Self-pay | Admitting: Cardiology

## 2021-12-30 ENCOUNTER — Ambulatory Visit: Payer: BC Managed Care – PPO | Admitting: Cardiology

## 2021-12-30 ENCOUNTER — Ambulatory Visit: Payer: BC Managed Care – PPO | Admitting: Student

## 2021-12-30 VITALS — BP 148/75 | Temp 98.4°F | Resp 16 | Ht 74.0 in | Wt 237.0 lb

## 2021-12-30 DIAGNOSIS — I25118 Atherosclerotic heart disease of native coronary artery with other forms of angina pectoris: Secondary | ICD-10-CM

## 2021-12-30 DIAGNOSIS — I10 Essential (primary) hypertension: Secondary | ICD-10-CM | POA: Diagnosis not present

## 2021-12-30 DIAGNOSIS — E7800 Pure hypercholesterolemia, unspecified: Secondary | ICD-10-CM

## 2021-12-30 DIAGNOSIS — I340 Nonrheumatic mitral (valve) insufficiency: Secondary | ICD-10-CM | POA: Diagnosis not present

## 2021-12-30 MED ORDER — SPIRONOLACTONE 25 MG PO TABS: 25.0000 mg | ORAL_TABLET | ORAL | 2 refills | Status: DC

## 2021-12-30 MED ORDER — AMLODIPINE BESYLATE 10 MG PO TABS: 10.0000 mg | ORAL_TABLET | Freq: Every day | ORAL | 3 refills | Status: DC

## 2021-12-30 NOTE — Progress Notes (Addendum)
Primary Physician/Referring:  Pietro Cassis, MD  Patient ID: Alejandro Parker, male    DOB: 01-07-1956, 66 y.o.   MRN: [id number]  No chief complaint on file.  HPI:    Alejandro Parker  is a 66 y.o. male  with CAD, hypertension, hyperglycemia and hyperlipidemia, Graves' disease, patient is following closely with Dr. Hartford Poli in endocrinology presents here for 56-monthoffice visit.  He remains asymptomatic..Marland Kitchen   Past Medical History:  Diagnosis Date   Allergic rhinitis    Corns and callosities 07/23/10   Coronary artery disease    Erectile dysfunction    GERD (gastroesophageal reflux disease)    Herpes, genital 06/24/2004   Hyperlipidemia 08/09/2001   Hypertension    Family History  Problem Relation Age of Onset   Heart attack Father    Hypertension Father    Ulcers Father        stomach   Breast cancer Mother    Past Surgical History:  Procedure Laterality Date   CORONARY ANGIOPLASTY WITH STENT PLACEMENT  10/22/10   CORONARY ANGIOPLASTY WITH STENT PLACEMENT     LEFT HEART CATH AND CORONARY ANGIOGRAPHY N/A 08/30/2016   Procedure: Left Heart Cath and Coronary Angiography;  Surgeon: GAdrian Prows MD;  Location: [facility];  Service: Cardiovascular;  Laterality: N/A;   Social History   Tobacco Use   Smoking status: Every Day    Types: Cigars   Smokeless tobacco: Never   Tobacco comments:    3 cigars daily  Substance Use Topics   Alcohol use: Yes    Comment: occ   ROS  Review of Systems  Cardiovascular:  Negative for chest pain, dyspnea on exertion and leg swelling.    Objective  Blood pressure (!) 148/75, temperature 98.4 F (36.9 C), temperature source Temporal, resp. rate 16, height _0  (1.88 m), weight 237 lb (107.5 kg).     12/30/2021   11:50 AM 07/01/2021   11:11 AM 07/01/2021   10:47 AM  Vitals with BMI  Height _1   _2   Weight 237 lbs  242 lbs  BMI 391.91 366.06 Systolic [id number] Diastolic 75 69 74  Pulse  50 55     Physical Exam Neck:      Vascular: No carotid bruit or JVD.  Cardiovascular:     Rate and Rhythm: Normal rate and regular rhythm.     Pulses: Intact distal pulses.     Heart sounds: Normal heart sounds. No murmur heard.    No gallop.  Pulmonary:     Effort: Pulmonary effort is normal.     Breath sounds: Normal breath sounds.  Abdominal:     General: Bowel sounds are normal.     Palpations: Abdomen is soft.  Musculoskeletal:     Right lower leg: No edema.     Left lower leg: No edema.    Heart rate while walking:  Start: 57 bpm  Half-way: 69 bpm  End: 7 bpm   Laboratory examination:   External Labs:    12/14/2020: Hgb 14.3, HCT 41.8, MCV 87.3, platelet 184 BUN 14, creatinine 1.05, GFR >60, sodium 143, potassium 3.3 Total cholesterol 144, triglycerides 109, HDL 53, LDL 71 TSH 4.04  Allergies   Allergies  Allergen Reactions   Aleve [Naproxen] Hives   Crestor [Rosuvastatin] Itching   Latex Itching   Other Rash    Synthetic rubber    Final Medications at End of Visit  Current Outpatient Medications:    aspirin 81 MG EC tablet, Take 1 tablet by mouth daily., Disp: , Rfl:    ezetimibe (ZETIA) 10 MG tablet, TAKE 1 TABLET BY MOUTH EVERY DAY, Disp: 30 tablet, Rfl: 5   IBUPROFEN EX, Take 1 tablet by mouth as needed., Disp: , Rfl:    methimazole (TAPAZOLE) 5 MG tablet, Take 5 mg by mouth 3 (three) times a week., Disp: , Rfl:    NITROSTAT 0.4 MG SL tablet, Place 0.4 mg under the tongue every 5 (five) minutes as needed for chest pain. As directed if needed, Disp: , Rfl:    NON FORMULARY, Take 1 tablet by mouth daily. OTC immune support - [place], Disp: , Rfl:    omeprazole (PRILOSEC) 20 MG capsule, Take 1 capsule (20 mg total) by mouth daily., Disp: 30 capsule, Rfl: 0   spironolactone (ALDACTONE) 25 MG tablet, Take 1 tablet (25 mg total) by mouth every morning., Disp: 30 tablet, Rfl: 2   tamsulosin (FLOMAX) 0.4 MG CAPS capsule, Take 1 tablet by mouth daily., Disp: , Rfl:     valsartan-hydrochlorothiazide (DIOVAN-HCT) 320-25 MG tablet, TAKE 1/2 TABLET BY MOUTH EVERY DAY, Disp: 45 tablet, Rfl: 7   amLODipine (NORVASC) 10 MG tablet, Take 1 tablet (10 mg total) by mouth daily., Disp: 90 tablet, Rfl: 3   atorvastatin (LIPITOR) 80 MG tablet, Take 1 tablet (80 mg total) by mouth daily at 6 PM., Disp: 90 tablet, Rfl: 3   Radiology:  No results found.  Cardiac Studies:    Lexiscan sestamibi stress test 08/12/2016: 1. Resting EKG demonstrates normal sinus rhythm.  Patient initially attempted treadmill exercise stress test in which he was able to exercise for 6:49 minutes and and achieved 8.30 METs, 79% of MPHR, stress test changed to Lexiscan due to chest pain and dyspnea and ST segment changes.  With exercise, patient developed 4 mm ST segment depression with T-wave inversion that persisted for 2 minutes into recovery.  Stress test was markedly abnormal and positive for myocardial ischemia. There were no EKG chagnes with Lexiscan infusion. 2.  SPECT images demonstrate Medium perfusion abnormality of moderate intensity in the basal inferior, mid inferior and apical inferior myocardial wall(s) on the stress images. In addition, there is a small perfusion abnormality of moderate intensity in the apical lateral myocardial wall(s) on the stress images. The inferior wall defect appears to be soft tissue attenuation and the small apical lateral defect a small area of ischemia. Gated SPECT images reveal normal myocardial thickening and wall motion.  The left ventricular ejection fraction was calculated or visually estimated to be 69%.  This is an intermediate risk study, clinical correlation recommended in view of abnormal EKG at submaximal exercise and associated chest pain.   Coronary angiogram 08/30/2016:  No change in coronary anatomy since 7/272012. Mid LAD 90% to 0% with 3.5x28 mm Promus (Post dilatation with 3.23m balloon) placed on 11/16/2010 widely patent.  Mild disease in the  LAD, ramus and circumflex coronary artery and right coronary artery.    PCV ECHOCARDIOGRAM COMPLETE 01/18/2021 Left ventricle cavity is normal in size. Mild concentric hypertrophy of the left ventricle. Normal LV systolic function with EF 63%. Normal global wall motion. Normal diastolic filling pattern. Trileaflet aortic valve with no regurgitation. Mild aortic valve leaflet thickening. No evidence of aortic valve stenosis or regurgitation. Mild (Grade I) mitral regurgitation. Unable to estimate PASP as IVC and TR jet not seen.   EKG  EKG 12/30/2021: Marked sinus bradycardia at rate  of 48 bpm, normal axis, otherwise normal EKG. was noted first-degree block and nonspecific T abnormality are present.  Assessment     ICD-10-CM   1. Coronary artery disease of native artery of native heart with stable angina pectoris ([place])  I25.118 EKG 12-Lead    2. Primary hypertension  I10 Lipoprotein A (LPA)    CMP14+EGFR    CBC    spironolactone (ALDACTONE) 25 MG tablet    3. Mild mitral regurgitation  I34.0     4. Pure hypercholesterolemia  E78.00 Lipid Panel With LDL/HDL Ratio      Meds ordered this encounter  Medications   amLODipine (NORVASC) 10 MG tablet    Sig: Take 1 tablet (10 mg total) by mouth daily.    Dispense:  90 tablet    Refill:  3    Discontinue carvedilol   spironolactone (ALDACTONE) 25 MG tablet    Sig: Take 1 tablet (25 mg total) by mouth every morning.    Dispense:  30 tablet    Refill:  2    Discontinue isosorbide mononitrate    Medications Discontinued During This Encounter  Medication Reason   ondansetron (ZOFRAN) 4 MG tablet    Cholecalciferol (VITAMIN D-3) 1000 units CAPS    diclofenac (VOLTAREN) 50 MG EC tablet    OVER THE COUNTER MEDICATION    OVER THE COUNTER MEDICATION    carvedilol (COREG) 6.25 MG tablet Change in therapy   amLODipine (NORVASC) 5 MG tablet Reorder   isosorbide mononitrate (IMDUR) 60 MG 24 hr tablet Discontinued by provider      Recommendations:   Alejandro Parker  is a 66 y.o. male  with CAD, hypertension, hyperglycemia and hyperlipidemia, Graves' disease, patient is following closely with Dr. Hartford Poli  Unfortunately still smoking about half a pack of cigarettes a day.  Smoking cessation again discussed with the patient.  But he has not had any further episodes of angina pectoris.  He remains asymptomatic and is presently working 2 jobs.  Physical examination is unremarkable.  EKG reveals marked sinus bradycardia.  No evidence of ischemia.  I will discontinue carvedilol and see if the improvement in heart rate would make systolic blood pressure less.  I will also increase his amlodipine to 10 mg daily and add spironolactone 25 mg in the morning, he needs labs, CMP, CBC, lipids will be obtained in 2 weeks and I would like to see him back in 4 weeks for follow-up.  If he remains stable we will continue to see him back on annual basis.   Adrian Prows, MD, [facility] 01/20/2022, 11:08 PM Office: [phone] Fax: [phone] Pager: [phone]   (Labs reviewed: Will need repatha and statins. "Prevail-ASCVD" study using Cholestryl Esther transfer protein inhibitor Obecertrapib 10 mg daily in patients with ASCVD on maximal lipid-lowering therapy, LDL >80 mg and CV risk reduction. ) Will discuss on OV soon.

## 2022-01-19 ENCOUNTER — Other Ambulatory Visit: Payer: Self-pay

## 2022-01-19 DIAGNOSIS — E7800 Pure hypercholesterolemia, unspecified: Secondary | ICD-10-CM | POA: Diagnosis not present

## 2022-01-19 DIAGNOSIS — I10 Essential (primary) hypertension: Secondary | ICD-10-CM | POA: Diagnosis not present

## 2022-01-19 MED ORDER — ATORVASTATIN CALCIUM 80 MG PO TABS: 80.0000 mg | ORAL_TABLET | Freq: Every day | ORAL | 3 refills | Status: DC

## 2022-01-20 LAB — CBC
Hematocrit: 41.3 % (ref 37.5–51.0)
Hemoglobin: 13.9 g/dL (ref 13.0–17.7)
MCH: 29.9 pg (ref 26.6–33.0)
MCHC: 33.7 g/dL (ref 31.5–35.7)
MCV: 89 fL (ref 79–97)
Platelets: 170 10*3/uL (ref 150–450)
RBC: 4.65 x10E6/uL (ref 4.14–5.80)
RDW: 14 % (ref 11.6–15.4)
WBC: 5.1 10*3/uL (ref 3.4–10.8)

## 2022-01-20 LAB — CMP14+EGFR
ALT: 13 IU/L (ref 0–44)
AST: 22 IU/L (ref 0–40)
Albumin/Globulin Ratio: 2.2 (ref 1.2–2.2)
Albumin: 4.9 g/dL (ref 3.9–4.9)
Alkaline Phosphatase: 72 IU/L (ref 44–121)
BUN/Creatinine Ratio: 17 (ref 10–24)
BUN: 17 mg/dL (ref 8–27)
Bilirubin Total: 0.5 mg/dL (ref 0.0–1.2)
CO2: 22 mmol/L (ref 20–29)
Calcium: 10.2 mg/dL (ref 8.6–10.2)
Chloride: 103 mmol/L (ref 96–106)
Creatinine, Ser: 1.03 mg/dL (ref 0.76–1.27)
Globulin, Total: 2.2 g/dL (ref 1.5–4.5)
Glucose: 81 mg/dL (ref 70–99)
Potassium: 4.1 mmol/L (ref 3.5–5.2)
Sodium: 142 mmol/L (ref 134–144)
Total Protein: 7.1 g/dL (ref 6.0–8.5)
eGFR: 80 mL/min/{1.73_m2} (ref >59)

## 2022-01-20 LAB — LIPID PANEL WITH LDL/HDL RATIO
Cholesterol, Total: 202 mg/dL — ABNORMAL HIGH (ref 100–199)
HDL: 54 mg/dL (ref >39)
LDL Chol Calc (NIH): 133 mg/dL — ABNORMAL HIGH (ref 0–99)
LDL/HDL Ratio: 2.5 ratio (ref 0.0–3.6)
Triglycerides: 82 mg/dL (ref 0–149)
VLDL Cholesterol Cal: 15 mg/dL (ref 5–40)

## 2022-01-20 LAB — LIPOPROTEIN A (LPA): Lipoprotein (a): 284.7 nmol/L — ABNORMAL HIGH (ref <75.0)

## 2022-01-20 NOTE — Progress Notes (Signed)
Look at "Prevail-ASCVD" study using CholestrylEsther transfer protein inhibitorObecertrapib 10 mg daily in patients with ASCVD on maximal lipid-lowering therapy, LDL >80 mg and CV risk reduction.

## 2022-01-20 NOTE — Progress Notes (Signed)
Will need Repatha

## 2022-01-27 ENCOUNTER — Ambulatory Visit: Payer: BC Managed Care – PPO

## 2022-02-01 NOTE — Progress Notes (Unsigned)
Primary Physician/Referring:  Pietro Cassis, MD  Patient ID: Alejandro Parker, male    DOB: [birth year], 66 y.o.   MRN: [id number]  No chief complaint on file.  HPI:    Alejandro Parker  is a 66 y.o. male  with CAD, hypertension, hyperglycemia and hyperlipidemia, Graves' disease, patient is following closely with Dr. Hartford Poli in endocrinology presents here for 52-month office visit.  He remains asymptomatic.Marland Kitchen    Past Medical History:  Diagnosis Date   Allergic rhinitis    Corns and callosities 07/23/10   Coronary artery disease    Erectile dysfunction    GERD (gastroesophageal reflux disease)    Herpes, genital 06/24/2004   Hyperlipidemia 08/09/2001   Hypertension    Family History  Problem Relation Age of Onset   Heart attack Father    Hypertension Father    Ulcers Father        stomach   Breast cancer Mother    Past Surgical History:  Procedure Laterality Date   CORONARY ANGIOPLASTY WITH STENT PLACEMENT  10/22/10   CORONARY ANGIOPLASTY WITH STENT PLACEMENT     LEFT HEART CATH AND CORONARY ANGIOGRAPHY N/A 08/30/2016   Procedure: Left Heart Cath and Coronary Angiography;  Surgeon: Adrian Prows, MD;  Location: [facility];  Service: Cardiovascular;  Laterality: N/A;   Social History   Tobacco Use   Smoking status: Every Day    Types: Cigars   Smokeless tobacco: Never   Tobacco comments:    3 cigars daily  Substance Use Topics   Alcohol use: Yes    Comment: occ   ROS  Review of Systems  Cardiovascular:  Negative for chest pain, dyspnea on exertion and leg swelling.    Objective  There were no vitals taken for this visit.     12/30/2021   11:50 AM 07/01/2021   11:11 AM 07/01/2021   10:47 AM  Vitals with BMI  Height 6\' 2"   6\' 2"   Weight 237 lbs  242 lbs  BMI 38.10  17.51  Systolic 025 852 778  Diastolic 75 69 74  Pulse  50 55     Physical Exam Neck:     Vascular: No carotid bruit or JVD.  Cardiovascular:     Rate and Rhythm: Normal rate and regular  rhythm.     Pulses: Intact distal pulses.     Heart sounds: Normal heart sounds. No murmur heard.    No gallop.  Pulmonary:     Effort: Pulmonary effort is normal.     Breath sounds: Normal breath sounds.  Abdominal:     General: Bowel sounds are normal.     Palpations: Abdomen is soft.  Musculoskeletal:     Right lower leg: No edema.     Left lower leg: No edema.    Heart rate while walking:  Start: 57 bpm  Half-way: 69 bpm  End: 7 bpm   Laboratory examination:   External Labs:   12/14/2020: Hgb 14.3, HCT 41.8, MCV 87.3, platelet 184 BUN 14, creatinine 1.05, GFR >60, sodium 143, potassium 3.3 Total cholesterol 144, triglycerides 109, HDL 53, LDL 71 TSH 4.04  Allergies   Allergies  Allergen Reactions   Aleve [Naproxen] Hives   Crestor [Rosuvastatin] Itching   Latex Itching   Other Rash    Synthetic rubber    Final Medications at End of Visit    Current Outpatient Medications:    amLODipine (NORVASC) 10 MG tablet, Take 1 tablet (10 mg total)  by mouth daily., Disp: 90 tablet, Rfl: 3   aspirin 81 MG EC tablet, Take 1 tablet by mouth daily., Disp: , Rfl:    atorvastatin (LIPITOR) 80 MG tablet, Take 1 tablet (80 mg total) by mouth daily at 6 PM., Disp: 90 tablet, Rfl: 3   ezetimibe (ZETIA) 10 MG tablet, TAKE 1 TABLET BY MOUTH EVERY DAY, Disp: 30 tablet, Rfl: 5   IBUPROFEN EX, Take 1 tablet by mouth as needed., Disp: , Rfl:    methimazole (TAPAZOLE) 5 MG tablet, Take 5 mg by mouth 3 (three) times a week., Disp: , Rfl:    NITROSTAT 0.4 MG SL tablet, Place 0.4 mg under the tongue every 5 (five) minutes as needed for chest pain. As directed if needed, Disp: , Rfl:    NON FORMULARY, Take 1 tablet by mouth daily. OTC immune support - [place], Disp: , Rfl:    omeprazole (PRILOSEC) 20 MG capsule, Take 1 capsule (20 mg total) by mouth daily., Disp: 30 capsule, Rfl: 0   spironolactone (ALDACTONE) 25 MG tablet, Take 1 tablet (25 mg total) by mouth every morning., Disp: 30 tablet,  Rfl: 2   tamsulosin (FLOMAX) 0.4 MG CAPS capsule, Take 1 tablet by mouth daily., Disp: , Rfl:    valsartan-hydrochlorothiazide (DIOVAN-HCT) 320-25 MG tablet, TAKE 1/2 TABLET BY MOUTH EVERY DAY, Disp: 45 tablet, Rfl: 7   Radiology:  No results found.  Cardiac Studies:    Lexiscan sestamibi stress test 08/12/2016: 1. Resting EKG demonstrates normal sinus rhythm.  Patient initially attempted treadmill exercise stress test in which he was able to exercise for 6:49 minutes and and achieved 8.30 METs, 79% of MPHR, stress test changed to Lexiscan due to chest pain and dyspnea and ST segment changes.  With exercise, patient developed 4 mm ST segment depression with T-wave inversion that persisted for 2 minutes into recovery.  Stress test was markedly abnormal and positive for myocardial ischemia. There were no EKG chagnes with Lexiscan infusion. 2.  SPECT images demonstrate Medium perfusion abnormality of moderate intensity in the basal inferior, mid inferior and apical inferior myocardial wall(s) on the stress images. In addition, there is a small perfusion abnormality of moderate intensity in the apical lateral myocardial wall(s) on the stress images. The inferior wall defect appears to be soft tissue attenuation and the small apical lateral defect a small area of ischemia. Gated SPECT images reveal normal myocardial thickening and wall motion.  The left ventricular ejection fraction was calculated or visually estimated to be 69%.  This is an intermediate risk study, clinical correlation recommended in view of abnormal EKG at submaximal exercise and associated chest pain.   Coronary angiogram 08/30/2016:  No change in coronary anatomy since 7/272012. Mid LAD 90% to 0% with 3.5x28 mm Promus (Post dilatation with 3.79mm balloon) placed on 11/16/2010 widely patent.  Mild disease in the LAD, ramus and circumflex coronary artery and right coronary artery.    PCV ECHOCARDIOGRAM COMPLETE 01/18/2021 Left  ventricle cavity is normal in size. Mild concentric hypertrophy of the left ventricle. Normal LV systolic function with EF 63%. Normal global wall motion. Normal diastolic filling pattern. Trileaflet aortic valve with no regurgitation. Mild aortic valve leaflet thickening. No evidence of aortic valve stenosis or regurgitation. Mild (Grade I) mitral regurgitation. Unable to estimate PASP as IVC and TR jet not seen.   EKG  EKG 12/30/2021: Marked sinus bradycardia at rate of 48 bpm, normal axis, otherwise normal EKG. was noted first-degree block and nonspecific T abnormality are  present.  Assessment   No diagnosis found.   No orders of the defined types were placed in this encounter.   There are no discontinued medications.    Recommendations:   Alejandro Parker  is a 66 y.o. male  with CAD, hypertension, hyperglycemia and hyperlipidemia, Graves' disease, patient is following closely with Dr. Shawnee Knapp  Unfortunately still smoking about half a pack of cigarettes a day.  Smoking cessation again discussed with the patient.  But he has not had any further episodes of angina pectoris.  He remains asymptomatic and is presently working 2 jobs.  Physical examination is unremarkable.  EKG reveals marked sinus bradycardia.  No evidence of ischemia.  I will discontinue carvedilol and see if the improvement in heart rate would make systolic blood pressure less.  I will also increase his amlodipine to 10 mg daily and add spironolactone 25 mg in the morning, he needs labs, CMP, CBC, lipids will be obtained in 2 weeks and I would like to see him back in 4 weeks for follow-up.  If he remains stable we will continue to see him back on annual basis.   Nori Riis, MD, [facility] 02/01/2022, 4:44 PM Office: [phone] Fax: [phone] Pager: [phone]   (Labs reviewed: Will need repatha and statins. "Prevail-ASCVD" study using Cholestryl Esther transfer protein inhibitor Obecertrapib 10 mg daily in  patients with ASCVD on maximal lipid-lowering therapy, LDL >80 mg and CV risk reduction. ) Will discuss on OV soon.

## 2022-02-02 ENCOUNTER — Ambulatory Visit: Payer: BC Managed Care – PPO

## 2022-02-02 VITALS — BP 122/63 | HR 58 | Ht 74.5 in | Wt 240.0 lb

## 2022-02-02 DIAGNOSIS — I10 Essential (primary) hypertension: Secondary | ICD-10-CM

## 2022-02-02 DIAGNOSIS — E7800 Pure hypercholesterolemia, unspecified: Secondary | ICD-10-CM | POA: Diagnosis not present

## 2022-02-02 DIAGNOSIS — F17290 Nicotine dependence, other tobacco product, uncomplicated: Secondary | ICD-10-CM

## 2022-02-21 DIAGNOSIS — E0500 Thyrotoxicosis with diffuse goiter without thyrotoxic crisis or storm: Secondary | ICD-10-CM | POA: Diagnosis not present

## 2022-02-21 DIAGNOSIS — Z9861 Coronary angioplasty status: Secondary | ICD-10-CM | POA: Diagnosis not present

## 2022-02-21 DIAGNOSIS — E785 Hyperlipidemia, unspecified: Secondary | ICD-10-CM | POA: Diagnosis not present

## 2022-03-10 ENCOUNTER — Other Ambulatory Visit: Payer: Self-pay | Admitting: Cardiology

## 2022-03-11 DIAGNOSIS — R058 Other specified cough: Secondary | ICD-10-CM | POA: Diagnosis not present

## 2022-03-11 DIAGNOSIS — J019 Acute sinusitis, unspecified: Secondary | ICD-10-CM | POA: Diagnosis not present

## 2022-03-11 DIAGNOSIS — R0981 Nasal congestion: Secondary | ICD-10-CM | POA: Diagnosis not present

## 2022-03-11 DIAGNOSIS — J029 Acute pharyngitis, unspecified: Secondary | ICD-10-CM | POA: Diagnosis not present

## 2022-03-25 ENCOUNTER — Other Ambulatory Visit: Payer: Self-pay | Admitting: Cardiology

## 2022-03-25 DIAGNOSIS — I10 Essential (primary) hypertension: Secondary | ICD-10-CM

## 2022-04-04 DIAGNOSIS — Z0000 Encounter for general adult medical examination without abnormal findings: Secondary | ICD-10-CM | POA: Diagnosis not present

## 2022-04-04 DIAGNOSIS — I2510 Atherosclerotic heart disease of native coronary artery without angina pectoris: Secondary | ICD-10-CM | POA: Diagnosis not present

## 2022-04-04 DIAGNOSIS — E785 Hyperlipidemia, unspecified: Secondary | ICD-10-CM | POA: Diagnosis not present

## 2022-04-04 DIAGNOSIS — I10 Essential (primary) hypertension: Secondary | ICD-10-CM | POA: Diagnosis not present

## 2022-04-26 DIAGNOSIS — F17210 Nicotine dependence, cigarettes, uncomplicated: Secondary | ICD-10-CM | POA: Diagnosis not present

## 2022-06-19 ENCOUNTER — Other Ambulatory Visit: Payer: Self-pay | Admitting: Cardiology

## 2022-06-19 DIAGNOSIS — I10 Essential (primary) hypertension: Secondary | ICD-10-CM

## 2022-07-11 ENCOUNTER — Emergency Department (HOSPITAL_BASED_OUTPATIENT_CLINIC_OR_DEPARTMENT_OTHER)
Admission: EM | Admit: 2022-07-11 | Discharge: 2022-07-12 | Disposition: A | Discharge status: Home / Self Care | Payer: Worker's Compensation | Attending: Emergency Medicine | Admitting: Emergency Medicine

## 2022-07-11 ENCOUNTER — Encounter (HOSPITAL_BASED_OUTPATIENT_CLINIC_OR_DEPARTMENT_OTHER): Payer: Self-pay

## 2022-07-11 ENCOUNTER — Other Ambulatory Visit: Payer: Self-pay

## 2022-07-11 ENCOUNTER — Emergency Department (HOSPITAL_BASED_OUTPATIENT_CLINIC_OR_DEPARTMENT_OTHER): Payer: Worker's Compensation

## 2022-07-11 DIAGNOSIS — S6992XA Unspecified injury of left wrist, hand and finger(s), initial encounter: Secondary | ICD-10-CM | POA: Diagnosis present

## 2022-07-11 DIAGNOSIS — Z23 Encounter for immunization: Secondary | ICD-10-CM | POA: Insufficient documentation

## 2022-07-11 DIAGNOSIS — W268XXA Contact with other sharp object(s), not elsewhere classified, initial encounter: Secondary | ICD-10-CM | POA: Diagnosis not present

## 2022-07-11 DIAGNOSIS — S62631A Displaced fracture of distal phalanx of left index finger, initial encounter for closed fracture: Secondary | ICD-10-CM | POA: Diagnosis not present

## 2022-07-11 DIAGNOSIS — Z91040 Latex allergy status: Secondary | ICD-10-CM | POA: Insufficient documentation

## 2022-07-11 DIAGNOSIS — S61211A Laceration without foreign body of left index finger without damage to nail, initial encounter: Secondary | ICD-10-CM

## 2022-07-11 DIAGNOSIS — S62639B Displaced fracture of distal phalanx of unspecified finger, initial encounter for open fracture: Secondary | ICD-10-CM

## 2022-07-11 MED ORDER — HYDROCODONE-ACETAMINOPHEN 5-325 MG PO TABS
1.0000 | ORAL_TABLET | Freq: Once | ORAL | Status: AC
  Administered 2022-07-11: 1 via ORAL
  Filled 2022-07-11: qty 1

## 2022-07-11 MED ORDER — LIDOCAINE HCL URETHRAL/MUCOSAL 2 % EX GEL
1.0000 | Freq: Once | CUTANEOUS | Status: AC
  Administered 2022-07-11: 1 via TOPICAL

## 2022-07-11 MED ORDER — CEFAZOLIN SODIUM-DEXTROSE 1-4 GM/50ML-% IV SOLN
1.0000 g | Freq: Once | INTRAVENOUS | Status: AC
  Administered 2022-07-11: 1 g via INTRAVENOUS
  Filled 2022-07-11: qty 50

## 2022-07-11 MED ORDER — CEPHALEXIN 500 MG PO CAPS: 500.0000 mg | ORAL_CAPSULE | Freq: Four times a day (QID) | ORAL | 0 refills | Status: DC

## 2022-07-11 MED ORDER — TETANUS-DIPHTH-ACELL PERTUSSIS 5-2.5-18.5 LF-MCG/0.5 IM SUSY
0.5000 mL | PREFILLED_SYRINGE | Freq: Once | INTRAMUSCULAR | Status: AC
  Administered 2022-07-11: 0.5 mL via INTRAMUSCULAR
  Filled 2022-07-11: qty 0.5

## 2022-07-11 MED ORDER — HYDROCODONE-ACETAMINOPHEN 5-325 MG PO TABS: 1.0000 | ORAL_TABLET | Freq: Four times a day (QID) | ORAL | 0 refills | Status: DC | PRN

## 2022-07-11 NOTE — ED Notes (Signed)
Pt states he injury his left index finger at work "cut it with lg piece of steel"  Bandage in place bleeding is controled

## 2022-07-11 NOTE — ED Provider Notes (Signed)
[place] [facility] DEPARTMENT AT [facility] Provider Note   CSN: [id number] Arrival date & time: 07/11/22  2022     History  Chief Complaint  Patient presents with   Finger Injury    Alejandro Parker is a 67 y.o. male.  HPI   68 year old male presenting to the emergency department with a left finger injury.  The patient states that he cut it with a large piece of steel when it fell on his finger earlier today.  He sustained pain in the finger with a small laceration.  Bleeding was controlled.  Patient refused pain meds in triage.  Unsure of his last tetanus status.  Appears to be last in 2016.  He denies any other injuries or complaints.  Home Medications Prior to Admission medications   Medication Sig Start Date End Date Taking? Authorizing Provider  cephALEXin (KEFLEX) 500 MG capsule Take 1 capsule (500 mg total) by mouth 4 (four) times daily. 07/11/22  Yes Ernie Avena, MD  HYDROcodone-acetaminophen (NORCO/VICODIN) 5-325 MG tablet Take 1-2 tablets by mouth every 6 (six) hours as needed. 07/11/22  Yes Ernie Avena, MD  amLODipine (NORVASC) 10 MG tablet Take 1 tablet (10 mg total) by mouth daily. 12/30/21   Yates Decamp, MD  aspirin 81 MG EC tablet Take 1 tablet by mouth daily.    [provider]  atorvastatin (LIPITOR) 80 MG tablet Take 1 tablet (80 mg total) by mouth daily at 6 PM. 01/19/22   Yates Decamp, MD  ezetimibe (ZETIA) 10 MG tablet TAKE 1 TABLET BY MOUTH EVERY DAY 10/12/21   Cantwell, Celeste C, PA-C  IBUPROFEN EX Take 1 tablet by mouth as needed.    [provider]  methimazole (TAPAZOLE) 5 MG tablet Take 5 mg by mouth 3 (three) times a week. 06/19/21   [provider]  NITROSTAT 0.4 MG SL tablet Place 0.4 mg under the tongue every 5 (five) minutes as needed for chest pain. As directed if needed 10/23/10   [provider]  NON FORMULARY Take 1 tablet by mouth daily. OTC immune support - [place]    [provider]   omeprazole (PRILOSEC) 20 MG capsule Take 1 capsule (20 mg total) by mouth daily. 11/12/12   Benjiman Core, MD  spironolactone (ALDACTONE) 25 MG tablet TAKE 1 TABLET BY MOUTH EVERY DAY IN THE MORNING 06/20/22   Yates Decamp, MD  tamsulosin (FLOMAX) 0.4 MG CAPS capsule Take 1 tablet by mouth daily. 09/29/21   [provider]  valsartan-hydrochlorothiazide (DIOVAN-HCT) 320-25 MG tablet TAKE 1/2 TABLET BY MOUTH EVERY DAY 08/31/21   Yates Decamp, MD      Allergies    Aleve [naproxen], Crestor [rosuvastatin], Latex, and Other    Review of Systems   Review of Systems  Skin:  Positive for wound.  All other systems reviewed and are negative.   Physical Exam Updated Vital Signs BP 132/71 (BP Location: Left Arm)   Pulse 65   Temp 97.7 F (36.5 C)   Resp 18   Ht 6' 2.5" (1.892 m)   Wt 104.3 kg   SpO2 99%   BMI 29.14 kg/m  Physical Exam Vitals and nursing note reviewed.  Constitutional:      General: He is not in acute distress. HENT:     Head: Normocephalic and atraumatic.  Eyes:     Conjunctiva/sclera: Conjunctivae normal.     Pupils: Pupils are equal, round, and reactive to light.  Cardiovascular:     Rate and  Rhythm: Normal rate and regular rhythm.  Pulmonary:     Effort: Pulmonary effort is normal. No respiratory distress.  Abdominal:     General: There is no distension.     Tenderness: There is no guarding.  Musculoskeletal:        General: No deformity or signs of injury.     Cervical back: Neck supple.     Comments: Left finger with small open laceration with tenderness to palpation, hemostatic  Skin:    Findings: No lesion or rash.  Neurological:     General: No focal deficit present.     Mental Status: He is alert. Mental status is at baseline.     ED Results / Procedures / Treatments   Labs (all labs ordered are listed, but only abnormal results are displayed) Labs Reviewed - No data to display  EKG None  Radiology DG Finger Index Left  Result  Date: 07/11/2022 CLINICAL DATA:  Injury from metal. EXAM: LEFT INDEX FINGER 2+V COMPARISON:  None Available. FINDINGS: Comminuted and mildly displaced distal phalanx fracture. There is extension to the distal interphalangeal joint. No significant articular displacement. Overlying soft tissue edema. No radiopaque foreign body. The proximal digit is intact. IMPRESSION: Comminuted and mildly displaced distal phalanx fracture with extension to the distal interphalangeal joint. Electronically Signed   By: Narda Rutherford M.D.   On: 07/11/2022 21:12    Procedures .Marland KitchenLaceration Repair  Date/Time: 07/11/2022 11:53 PM  Performed by: Ernie Avena, MD Authorized by: Ernie Avena, MD   Consent:    Consent obtained:  Verbal   Consent given by:  Patient   Risks discussed:  Infection, pain and need for additional repair Universal protocol:    Patient identity confirmed:  Verbally with patient Anesthesia:    Anesthesia method:  Topical application   Topical anesthetic:  Lidocaine gel Laceration details:    Location:  Finger   Finger location:  L index finger   Length (cm):  0.5   Depth (mm):  1 Pre-procedure details:    Preparation:  Imaging obtained to evaluate for foreign bodies Exploration:    Imaging obtained: x-ray     Imaging outcome: foreign body not noted     Wound exploration: entire depth of wound visualized   Treatment:    Area cleansed with:  Saline   Amount of cleaning:  Standard Skin repair:    Repair method:  Sutures   Suture size:  5-0   Suture material:  Prolene   Suture technique:  Simple interrupted   Number of sutures:  2 Approximation:    Approximation:  Loose Repair type:    Repair type:  Simple Post-procedure details:    Dressing:  Bulky dressing (Xeroform)   Procedure completion:  Tolerated     Medications Ordered in ED Medications  HYDROcodone-acetaminophen (NORCO/VICODIN) 5-325 MG per tablet 1 tablet (1 tablet Oral Given 07/11/22 2159)  ceFAZolin (ANCEF)  IVPB 1 g/50 mL premix (0 g Intravenous Stopped 07/11/22 2315)  Tdap (BOOSTRIX) injection 0.5 mL (0.5 mLs Intramuscular Given 07/11/22 2246)  lidocaine (XYLOCAINE) 2 % jelly 1 Application (1 Application Topical Given 07/11/22 2252)    ED Course/ Medical Decision Making/ A&P                             Medical Decision Making Amount and/or Complexity of Data Reviewed Radiology: ordered.  Risk Prescription drug management.    67 year old male presenting to the emergency department with  a left finger injury.  The patient states that he cut it with a large piece of steel when it fell on his finger earlier today.  He sustained pain in the finger with a small laceration.  Bleeding was controlled.  Patient refused pain meds in triage.  Unsure of his last tetanus status.  Appears to be last in 2016.  He denies any other injuries or complaints.  X-ray imaging was performed which revealed an open tuft fracture of the distal phalanx involving the DIP joint.  IV Ancef was provided and the patient's tetanus was updated.  The wound was cleaned by nursing staff bedside.  Spoke with Dr. Melvyn Novas who agreed with a plan for clinic follow-up.  2 sutures were placed in the patient's laceration which were loosely closed.  The finger was then wrapped with Xeroform and Kerlix gauze and wrapped/splinted with the adjacent middle finger.  Patient was provided with wound care instructions and return precautions.  Will discharge the patient on a course of Keflex, Norco provided for pain control, plan for outpatient close follow-up with hand surgery.   Final Clinical Impression(s) / ED Diagnoses Final diagnoses:  Laceration of left index finger without foreign body without damage to nail, initial encounter  Open fracture of tuft of distal phalanx of finger    Rx / DC Orders ED Discharge Orders          Ordered    Ambulatory referral to Orthopedics        07/11/22 2259    HYDROcodone-acetaminophen (NORCO/VICODIN)  5-325 MG tablet  Every 6 hours PRN        07/11/22 2353    cephALEXin (KEFLEX) 500 MG capsule  4 times daily        07/11/22 2353              Ernie Avena, MD 07/11/22 2358

## 2022-07-11 NOTE — ED Triage Notes (Addendum)
Pt had left hand Index finger injury at work at [employer].  Bleeding is controlled at this time - rewrapped in triage.  Pt does not want any pain meds at this time in triage.

## 2022-07-11 NOTE — Discharge Instructions (Addendum)
Please follow-up outpatient with Dr. Orlan Leavens of hand surgery, take your antibiotics as prescribed, pain meds have also been sent to your pharmacy.  You have an open fracture that involves the distal joint of your finger so it is imperative that you follow-up with a hand surgeon.

## 2022-07-11 NOTE — ED Notes (Signed)
Finger irrigated and cleaned well.  Pt tolerated well

## 2022-08-01 ENCOUNTER — Other Ambulatory Visit: Payer: Self-pay | Admitting: Cardiology

## 2022-08-01 DIAGNOSIS — I10 Essential (primary) hypertension: Secondary | ICD-10-CM

## 2022-08-03 ENCOUNTER — Ambulatory Visit: Payer: BC Managed Care – PPO

## 2022-08-03 ENCOUNTER — Encounter: Payer: Self-pay | Admitting: Internal Medicine

## 2022-08-03 ENCOUNTER — Ambulatory Visit: Payer: BC Managed Care – PPO | Admitting: Internal Medicine

## 2022-08-03 VITALS — BP 126/71 | HR 60 | Resp 16 | Ht 74.0 in | Wt 237.8 lb

## 2022-08-03 DIAGNOSIS — I25118 Atherosclerotic heart disease of native coronary artery with other forms of angina pectoris: Secondary | ICD-10-CM | POA: Diagnosis not present

## 2022-08-03 DIAGNOSIS — I10 Essential (primary) hypertension: Secondary | ICD-10-CM

## 2022-08-03 DIAGNOSIS — E7800 Pure hypercholesterolemia, unspecified: Secondary | ICD-10-CM | POA: Diagnosis not present

## 2022-08-03 NOTE — Progress Notes (Signed)
Primary Physician/Referring:  Sheilah Pigeon, MD  Patient ID: Alejandro Parker, male    DOB: [birth year], 67 y.o.   MRN: [id number]  No chief complaint on file.  HPI:    Alejandro Parker  is a 67 y.o. male  with CAD, hypertension, hyperglycemia and hyperlipidemia, Graves' disease, patient is following closely with Dr. Shawnee Knapp in endocrinology.  Patient presents today for a follow-up visit. He has been doing well since the last time he was here. No complaints or concerns today. He does not have any anginal symptoms with maximal activity. He is taking his medications without side effects. He denies chest pain, shortness of breath, palpitations, leg edema, orthopnea, PND, syncope.  Past Medical History:  Diagnosis Date   Allergic rhinitis    Corns and callosities 07/23/10   Coronary artery disease    Erectile dysfunction    GERD (gastroesophageal reflux disease)    Herpes, genital 06/24/2004   Hyperlipidemia 08/09/2001   Hypertension    Family History  Problem Relation Age of Onset   Heart attack Father    Hypertension Father    Ulcers Father        stomach   Breast cancer Mother    Past Surgical History:  Procedure Laterality Date   CORONARY ANGIOPLASTY WITH STENT PLACEMENT  10/22/10   CORONARY ANGIOPLASTY WITH STENT PLACEMENT     LEFT HEART CATH AND CORONARY ANGIOGRAPHY N/A 08/30/2016   Procedure: Left Heart Cath and Coronary Angiography;  Surgeon: Yates Decamp, MD;  Location: [facility];  Service: Cardiovascular;  Laterality: N/A;   Social History   Tobacco Use   Smoking status: Every Day    Types: Cigars   Smokeless tobacco: Never   Tobacco comments:    3 cigars daily  Substance Use Topics   Alcohol use: Yes    Comment: occ   ROS  Review of Systems  Cardiovascular:  Negative for chest pain, dyspnea on exertion, leg swelling, orthopnea, palpitations and paroxysmal nocturnal dyspnea.    Objective  Blood pressure 126/71, pulse 60, resp. rate 16, height 6\' 2"   (1.88 m), weight 237 lb 12.8 oz (107.9 kg), SpO2 98 %.     08/03/2022    3:01 PM 07/11/2022    8:35 PM 07/11/2022    8:34 PM  Vitals with BMI  Height 6\' 2"   6' 2.5"  Weight 237 lbs 13 oz  230 lbs  BMI 30.52  29.14  Systolic 126 132   Diastolic 71 71   Pulse 60 65      Physical Exam Neck:     Vascular: No carotid bruit or JVD.  Cardiovascular:     Rate and Rhythm: Normal rate and regular rhythm.     Pulses: Intact distal pulses.     Heart sounds: Normal heart sounds. No murmur heard.    No gallop.  Pulmonary:     Effort: Pulmonary effort is normal.     Breath sounds: Normal breath sounds.  Abdominal:     General: Bowel sounds are normal.     Palpations: Abdomen is soft.  Musculoskeletal:     Right lower leg: No edema.     Left lower leg: No edema.     Laboratory examination:      Latest Ref Rng & Units 01/19/2022    1:11 PM 09/27/2016    1:28 PM 11/12/2012    1:41 PM  BMP  Glucose 70 - 99 mg/dL 81  469  93   BUN  8 - 27 mg/dL 17  16  18    Creatinine 0.76 - 1.27 mg/dL 1.61  0.96  0.45   BUN/Creat Ratio 10 - 24 17     Sodium 134 - 144 mmol/L 142  140  140   Potassium 3.5 - 5.2 mmol/L 4.1  3.7  3.6   Chloride 96 - 106 mmol/L 103  107  106   CO2 20 - 29 mmol/L 22  27  25    Calcium 8.6 - 10.2 mg/dL 40.9  81.1  9.3    CBC    Component Value Date/Time   WBC 5.1 01/19/2022 1311   WBC 5.1 09/27/2016 1328   RBC 4.65 01/19/2022 1311   RBC 3.38 (L) 09/27/2016 1328   HGB 13.9 01/19/2022 1311   HCT 41.3 01/19/2022 1311   PLT 170 01/19/2022 1311   MCV 89 01/19/2022 1311   MCH 29.9 01/19/2022 1311   MCH 26.3 09/27/2016 1328   MCHC 33.7 01/19/2022 1311   MCHC 32.1 09/27/2016 1328   RDW 14.0 01/19/2022 1311   LYMPHSABS 2.1 09/27/2016 1328   MONOABS 0.8 09/27/2016 1328   EOSABS 0.2 09/27/2016 1328   BASOSABS 0.0 09/27/2016 1328   Lipid Panel     Component Value Date/Time   CHOL 202 (H) 01/19/2022 1311   TRIG 82 01/19/2022 1311   HDL 54 01/19/2022 1311   CHOLHDL 3.3  09/27/2016 1328   VLDL 21 09/27/2016 1328   LDLCALC 133 (H) 01/19/2022 1311   LABVLDL 15 01/19/2022 1311   Lipoprotein A 02/02/2022: 284.7  External Labs:   12/14/2020: Hgb 14.3, HCT 41.8, MCV 87.3, platelet 184 BUN 14, creatinine 1.05, GFR >60, sodium 143, potassium 3.3 Total cholesterol 144, triglycerides 109, HDL 53, LDL 71 TSH 4.04  Allergies   Allergies  Allergen Reactions   Aleve [Naproxen] Hives   Crestor [Rosuvastatin] Itching   Latex Itching   Other Rash    Synthetic rubber    Final Medications at End of Visit    Current Outpatient Medications:    amLODipine (NORVASC) 10 MG tablet, Take 1 tablet (10 mg total) by mouth daily., Disp: 90 tablet, Rfl: 3   aspirin 81 MG EC tablet, Take 1 tablet by mouth daily., Disp: , Rfl:    atorvastatin (LIPITOR) 80 MG tablet, Take 1 tablet (80 mg total) by mouth daily at 6 PM., Disp: 90 tablet, Rfl: 3   ezetimibe (ZETIA) 10 MG tablet, TAKE 1 TABLET BY MOUTH EVERY DAY, Disp: 30 tablet, Rfl: 5   HYDROcodone-acetaminophen (NORCO/VICODIN) 5-325 MG tablet, Take 1-2 tablets by mouth every 6 (six) hours as needed., Disp: 10 tablet, Rfl: 0   IBUPROFEN EX, Take 1 tablet by mouth as needed., Disp: , Rfl:    methimazole (TAPAZOLE) 5 MG tablet, Take 5 mg by mouth 3 (three) times a week., Disp: , Rfl:    NITROSTAT 0.4 MG SL tablet, Place 0.4 mg under the tongue every 5 (five) minutes as needed for chest pain. As directed if needed, Disp: , Rfl:    NON FORMULARY, Take 1 tablet by mouth daily. OTC immune support - [place], Disp: , Rfl:    omeprazole (PRILOSEC) 20 MG capsule, Take 1 capsule (20 mg total) by mouth daily., Disp: 30 capsule, Rfl: 0   tamsulosin (FLOMAX) 0.4 MG CAPS capsule, Take 1 tablet by mouth daily., Disp: , Rfl:    valsartan-hydrochlorothiazide (DIOVAN-HCT) 320-25 MG tablet, TAKE 1/2 TABLET BY MOUTH EVERY DAY, Disp: 45 tablet, Rfl: 7   spironolactone (ALDACTONE) 25  MG tablet, TAKE 1 TABLET BY MOUTH EVERY DAY IN THE MORNING, Disp: 30  tablet, Rfl: 2   Radiology:  No results found.  Cardiac Studies:    Lexiscan sestamibi stress test 08/12/2016: 1. Resting EKG demonstrates normal sinus rhythm.  Patient initially attempted treadmill exercise stress test in which he was able to exercise for 6:49 minutes and and achieved 8.30 METs, 79% of MPHR, stress test changed to Lexiscan due to chest pain and dyspnea and ST segment changes.  With exercise, patient developed 4 mm ST segment depression with T-wave inversion that persisted for 2 minutes into recovery.  Stress test was markedly abnormal and positive for myocardial ischemia. There were no EKG chagnes with Lexiscan infusion. 2.  SPECT images demonstrate Medium perfusion abnormality of moderate intensity in the basal inferior, mid inferior and apical inferior myocardial wall(s) on the stress images. In addition, there is a small perfusion abnormality of moderate intensity in the apical lateral myocardial wall(s) on the stress images. The inferior wall defect appears to be soft tissue attenuation and the small apical lateral defect a small area of ischemia. Gated SPECT images reveal normal myocardial thickening and wall motion.  The left ventricular ejection fraction was calculated or visually estimated to be 69%.  This is an intermediate risk study, clinical correlation recommended in view of abnormal EKG at submaximal exercise and associated chest pain.   Coronary angiogram 08/30/2016:  No change in coronary anatomy since 7/272012. Mid LAD 90% to 0% with 3.5x28 mm Promus (Post dilatation with 3.4mm balloon) placed on 11/16/2010 widely patent.  Mild disease in the LAD, ramus and circumflex coronary artery and right coronary artery.    PCV ECHOCARDIOGRAM COMPLETE 01/18/2021 Left ventricle cavity is normal in size. Mild concentric hypertrophy of the left ventricle. Normal LV systolic function with EF 63%. Normal global wall motion. Normal diastolic filling pattern. Trileaflet aortic  valve with no regurgitation. Mild aortic valve leaflet thickening. No evidence of aortic valve stenosis or regurgitation. Mild (Grade I) mitral regurgitation. Unable to estimate PASP as IVC and TR jet not seen.   EKG  EKG 12/30/2021: Marked sinus bradycardia at rate of 48 bpm, normal axis, otherwise normal EKG. was noted first-degree block and nonspecific T abnormality are present.  Assessment     ICD-10-CM   1. Coronary artery disease of native artery of native heart with stable angina pectoris (HCC)  I25.118 EKG 12-Lead    2. Pure hypercholesterolemia  E78.00     3. Essential hypertension, benign  I10        No orders of the defined types were placed in this encounter.   Medications Discontinued During This Encounter  Medication Reason   cephALEXin (KEFLEX) 500 MG capsule Completed Course      Recommendations:   Alejandro Parker  is a 67 y.o. male  with CAD, hypertension, hyperglycemia and hyperlipidemia, Graves' disease, patient is following closely with Dr. Shawnee Knapp  Pure hypercholesterolemia He continues on atorvastatin and Zetia without myalgias.   Coronary artery disease of native artery of native heart with stable angina pectoris (HCC) Continues on ASA Has not had any anginal symptoms He has not used SL nitro   Primary hypertension Blood pressure is well controlled Continue current cardiac medications. Encourage low-sodium diet, less than 2000 mg daily.    Follow-up in 6 months or sooner if needed.   Clotilde Dieter, DO 08/04/2022, 8:48 AM Office: [phone] Fax: [phone] Pager: [phone]

## 2022-08-15 NOTE — Progress Notes (Signed)
Yes

## 2022-09-06 ENCOUNTER — Other Ambulatory Visit: Payer: Self-pay | Admitting: Cardiology

## 2022-09-14 ENCOUNTER — Other Ambulatory Visit: Payer: Self-pay | Admitting: Cardiology

## 2022-10-31 ENCOUNTER — Other Ambulatory Visit: Payer: Self-pay | Admitting: Cardiology

## 2022-10-31 DIAGNOSIS — I10 Essential (primary) hypertension: Secondary | ICD-10-CM

## 2022-11-14 DIAGNOSIS — I10 Essential (primary) hypertension: Secondary | ICD-10-CM | POA: Diagnosis not present

## 2022-11-14 DIAGNOSIS — E0500 Thyrotoxicosis with diffuse goiter without thyrotoxic crisis or storm: Secondary | ICD-10-CM | POA: Diagnosis not present

## 2022-11-14 DIAGNOSIS — R200 Anesthesia of skin: Secondary | ICD-10-CM | POA: Diagnosis not present

## 2022-11-14 DIAGNOSIS — E785 Hyperlipidemia, unspecified: Secondary | ICD-10-CM | POA: Diagnosis not present

## 2022-11-14 DIAGNOSIS — I2510 Atherosclerotic heart disease of native coronary artery without angina pectoris: Secondary | ICD-10-CM | POA: Diagnosis not present

## 2022-11-23 ENCOUNTER — Encounter: Payer: Self-pay | Admitting: Cardiology

## 2022-11-23 ENCOUNTER — Ambulatory Visit: Payer: BC Managed Care – PPO | Admitting: Cardiology

## 2022-11-23 VITALS — BP 100/60 | HR 57 | Resp 16 | Ht 74.0 in | Wt 225.2 lb

## 2022-11-23 DIAGNOSIS — I25118 Atherosclerotic heart disease of native coronary artery with other forms of angina pectoris: Secondary | ICD-10-CM | POA: Diagnosis not present

## 2022-11-23 DIAGNOSIS — E7800 Pure hypercholesterolemia, unspecified: Secondary | ICD-10-CM | POA: Diagnosis not present

## 2022-11-23 DIAGNOSIS — I10 Essential (primary) hypertension: Secondary | ICD-10-CM | POA: Diagnosis not present

## 2022-11-23 DIAGNOSIS — Z006 Encounter for examination for normal comparison and control in clinical research program: Secondary | ICD-10-CM | POA: Insufficient documentation

## 2022-11-23 NOTE — Progress Notes (Signed)
Primary Physician/Referring:  Sheilah Pigeon, MD  Patient ID: Alejandro Parker, male    DOB: May 11, 1955, 67 y.o.   MRN: [id number]  Chief Complaint  Patient presents with   Low BP   HPI:    Alejandro Parker  is a 67 y.o. male with CAD and proximal LAD stent in 2012, hypertension, hyperglycemia and hyperlipidemia, Graves' disease, patient is following closely with Dr. Shawnee Knapp.  He is enrolled in clinical trial ACCLAIM-Lp(a)-assess the effect of Lipodisiran SQ yearly on the reduction of Mace in patients with LPA >175 and established ASCVD.  He was noted to have low blood pressure during his recent visit and he made an appointment to see [place].  He remains asymptomatic.  He is still smoking about 2 to 3 cigarettes a day much reduced from before, is now planning to quit.  He has recently had lung cancer screening.  Past Medical History:  Diagnosis Date   Allergic rhinitis    Corns and callosities 07/23/10   Coronary artery disease    Erectile dysfunction    GERD (gastroesophageal reflux disease)    Herpes, genital 06/24/2004   Hyperlipidemia 08/09/2001   Hypertension    Family History  Problem Relation Age of Onset   Heart attack Father    Hypertension Father    Ulcers Father        stomach   Breast cancer Mother    Past Surgical History:  Procedure Laterality Date   CORONARY ANGIOPLASTY WITH STENT PLACEMENT  10/22/10   CORONARY ANGIOPLASTY WITH STENT PLACEMENT     LEFT HEART CATH AND CORONARY ANGIOGRAPHY N/A 08/30/2016   Procedure: Left Heart Cath and Coronary Angiography;  Surgeon: Yates Decamp, MD;  Location: [facility] INVASIVE CV LAB;  Service: Cardiovascular;  Laterality: N/A;   Social History   Tobacco Use   Smoking status: Every Day    Types: Cigars   Smokeless tobacco: Never   Tobacco comments:    3 cigars daily  Substance Use Topics   Alcohol use: Yes    Comment: occasionally   ROS  Review of Systems  Cardiovascular:  Negative for chest pain, dyspnea on exertion and leg  swelling.    Objective  Blood pressure 100/60, pulse (!) 57, resp. rate 16, height 6\' 2"  (1.88 m), weight 225 lb 3.2 oz (102.2 kg), SpO2 98%.     11/23/2022    3:56 PM 08/03/2022    3:01 PM 07/11/2022    8:35 PM  Vitals with BMI  Height 6\' 2"  6\' 2"    Weight 225 lbs 3 oz 237 lbs 13 oz   BMI 28.9 30.52   Systolic 100 126 401  Diastolic 60 71 71  Pulse 57 60 65     Physical Exam Neck:     Vascular: No carotid bruit or JVD.  Cardiovascular:     Rate and Rhythm: Normal rate and regular rhythm.     Pulses: Intact distal pulses.     Heart sounds: Normal heart sounds. No murmur heard.    No gallop.  Pulmonary:     Effort: Pulmonary effort is normal.     Breath sounds: Normal breath sounds.  Abdominal:     General: Bowel sounds are normal.     Palpations: Abdomen is soft.  Musculoskeletal:     Right lower leg: No edema.     Left lower leg: No edema.    Laboratory examination:   External Labs:   Labs 11/14/2022:  Hb 12.9/HCT 38.6, platelets 172.  B12 439, TSH 2.840, normal.  Total cholesterol 03/28/1955, triglycerides 86, HDL 49, LDL 91.  Serum glucose 85 mg, BUN 11, creatinine 1.03, EGFR 80 mL, potassium 3.4, CMP normal.  Allergies   Allergies  Allergen Reactions   Aleve [Naproxen] Hives   Crestor [Rosuvastatin] Itching   Latex Itching   Other Rash    Synthetic rubber    Final Medications at End of Visit    Current Outpatient Medications:    aspirin 81 MG EC tablet, Take 1 tablet by mouth daily., Disp: , Rfl:    atorvastatin (LIPITOR) 80 MG tablet, TAKE 1 TABLET BY MOUTH DAILY AT 6 PM., Disp: 30 tablet, Rfl: 11   ezetimibe (ZETIA) 10 MG tablet, TAKE 1 TABLET BY MOUTH EVERY DAY, Disp: 30 tablet, Rfl: 5   IBUPROFEN EX, Take 1 tablet by mouth as needed., Disp: , Rfl:    methimazole (TAPAZOLE) 5 MG tablet, Take 5 mg by mouth 3 (three) times a week., Disp: , Rfl:    NITROSTAT 0.4 MG SL tablet, Place 0.4 mg under the tongue every 5 (five) minutes as needed for chest  pain. As directed if needed, Disp: , Rfl:    NON FORMULARY, Take 1 tablet by mouth daily. OTC immune support - [place], Disp: , Rfl:    spironolactone (ALDACTONE) 25 MG tablet, TAKE 1 TABLET BY MOUTH EVERY DAY IN THE MORNING, Disp: 30 tablet, Rfl: 2   tamsulosin (FLOMAX) 0.4 MG CAPS capsule, Take 1 tablet by mouth daily., Disp: , Rfl:    valsartan-hydrochlorothiazide (DIOVAN-HCT) 320-12.5 MG tablet, TAKE 1/2 TAB BY MOUTH EVERY DAY, Disp: 45 tablet, Rfl: 3   Radiology:   CT scan of the chest for lung cancer screening January 2024: Nonspecific small bilateral pulmonary nodules measuring up to 3 mm.   Cardiac Studies:    Lexiscan sestamibi stress test 08/12/2016: 1. Resting EKG demonstrates normal sinus rhythm.  Patient initially attempted treadmill exercise stress test in which he was able to exercise for 6:49 minutes and and achieved 8.30 METs, 79% of MPHR, stress test changed to Lexiscan due to chest pain and dyspnea and ST segment changes.  With exercise, patient developed 4 mm ST segment depression with T-wave inversion that persisted for 2 minutes into recovery.  Stress test was markedly abnormal and positive for myocardial ischemia. There were no EKG chagnes with Lexiscan infusion. 2.  SPECT images demonstrate Medium perfusion abnormality of moderate intensity in the basal inferior, mid inferior and apical inferior myocardial wall(s) on the stress images. In addition, there is a small perfusion abnormality of moderate intensity in the apical lateral myocardial wall(s) on the stress images. The inferior wall defect appears to be soft tissue attenuation and the small apical lateral defect a small area of ischemia. Gated SPECT images reveal normal myocardial thickening and wall motion.  The left ventricular ejection fraction was calculated or visually estimated to be 69%.  This is an intermediate risk study, clinical correlation recommended in view of abnormal EKG at submaximal exercise and associated  chest pain.   Coronary angiogram 08/30/2016:  No change in coronary anatomy since 7/272012. Mid LAD 90% to 0% with 3.5x28 mm Promus (Post dilatation with 3.50mm balloon) placed on 11/16/2010 widely patent.  Mild disease in the LAD, ramus and circumflex coronary artery and right coronary artery.    PCV ECHOCARDIOGRAM COMPLETE 01/18/2021 Left ventricle cavity is normal in size. Mild concentric hypertrophy of the left ventricle. Normal LV systolic function with EF 63%. Normal global wall motion. Normal  diastolic filling pattern. Trileaflet aortic valve with no regurgitation. Mild aortic valve leaflet thickening. No evidence of aortic valve stenosis or regurgitation. Mild (Grade I) mitral regurgitation. Unable to estimate PASP as IVC and TR jet not seen.   EKG  EKG 12/30/2021: Marked sinus bradycardia at rate of 48 bpm, normal axis, otherwise normal EKG. was noted first-degree block and nonspecific T abnormality are present.  Assessment     ICD-10-CM   1. Essential hypertension, benign  I10     2. Coronary artery disease of native artery of native heart with stable angina pectoris (HCC)  I25.118     3. Pure hypercholesterolemia  E78.00     4. Enrolled in clinical trial of drug: ACCLAIM-Lp(a)-effect of Lipodisiran SQ yearly on the reduction of Mace in patients with LPA >175 & ASCVD 10/03/2022  Z00.6        No orders of the defined types were placed in this encounter.   Medications Discontinued During This Encounter  Medication Reason   HYDROcodone-acetaminophen (NORCO/VICODIN) 5-325 MG tablet    omeprazole (PRILOSEC) 20 MG capsule    valsartan-hydrochlorothiazide (DIOVAN-HCT) 320-25 MG tablet    amLODipine (NORVASC) 10 MG tablet Discontinued by provider     Recommendations:   ARMOR BAUM  is a 67 y.o. male with CAD and proximal LAD stent in 2012, hypertension, hyperglycemia and hyperlipidemia, Graves' disease, patient is following closely with Dr. Shawnee Knapp.  1. Essential  hypertension, benign Patient made an appointment to see me as his blood pressure has been very low.  He has also lost about 25 pounds in weight with changes in his diet as well.  Advised him that his low blood pressure although he is asymptomatic will discontinue amlodipine is clearly related to his weight loss and lifestyle changes.  He is still smoking about 2 to 3 cigarettes a day much reduced from before, is now planning to quit.  He has recently had lung cancer screening January 2024 revealing no evidence of lung cancer.  2. Coronary artery disease of native artery of native heart with stable angina pectoris [facility]) He has not had any recent angina pectoris and has not used any sublingual nitroglycerin.  3. Pure hypercholesterolemia Lipids were not well-controlled and LDL was >70, presently on 80 mg of Lipitor and Zetia 10 mg, continue the same for now.  He is now enrolled in clinical trial as dictated below in view of elevated LDL and CAD.  4. Enrolled in clinical trial of drug: ACCLAIM-Lp(a)-effect of Lipodisiran SQ yearly on the reduction of Mace in patients with LPA >175 & ASCVD 10/03/2022 Patient is tolerating statin medication without any side effect.  I will see him back in 6 months and if he remains stable on annual basis.    Yates Decamp, MD, [facility] 11/23/2022, 7:37 PM Office: [phone] Fax: [phone] Pager: [phone]

## 2022-12-19 DIAGNOSIS — I10 Essential (primary) hypertension: Secondary | ICD-10-CM | POA: Diagnosis not present

## 2022-12-19 DIAGNOSIS — R200 Anesthesia of skin: Secondary | ICD-10-CM | POA: Diagnosis not present

## 2023-01-27 DIAGNOSIS — I2510 Atherosclerotic heart disease of native coronary artery without angina pectoris: Secondary | ICD-10-CM | POA: Diagnosis not present

## 2023-01-27 DIAGNOSIS — R001 Bradycardia, unspecified: Secondary | ICD-10-CM | POA: Diagnosis not present

## 2023-01-27 DIAGNOSIS — R079 Chest pain, unspecified: Secondary | ICD-10-CM | POA: Diagnosis not present

## 2023-01-27 DIAGNOSIS — I440 Atrioventricular block, first degree: Secondary | ICD-10-CM | POA: Diagnosis not present

## 2023-01-27 DIAGNOSIS — Z7982 Long term (current) use of aspirin: Secondary | ICD-10-CM | POA: Diagnosis not present

## 2023-01-27 DIAGNOSIS — R072 Precordial pain: Secondary | ICD-10-CM | POA: Diagnosis not present

## 2023-01-27 DIAGNOSIS — Z91040 Latex allergy status: Secondary | ICD-10-CM | POA: Diagnosis not present

## 2023-01-27 DIAGNOSIS — R918 Other nonspecific abnormal finding of lung field: Secondary | ICD-10-CM | POA: Diagnosis not present

## 2023-01-27 DIAGNOSIS — J984 Other disorders of lung: Secondary | ICD-10-CM | POA: Diagnosis not present

## 2023-01-27 DIAGNOSIS — Z955 Presence of coronary angioplasty implant and graft: Secondary | ICD-10-CM | POA: Diagnosis not present

## 2023-01-27 DIAGNOSIS — E876 Hypokalemia: Secondary | ICD-10-CM | POA: Diagnosis not present

## 2023-02-02 ENCOUNTER — Ambulatory Visit: Payer: Self-pay | Admitting: Cardiology

## 2023-02-03 ENCOUNTER — Other Ambulatory Visit: Payer: Self-pay | Admitting: Cardiology

## 2023-02-03 DIAGNOSIS — I10 Essential (primary) hypertension: Secondary | ICD-10-CM

## 2023-02-09 DIAGNOSIS — E0500 Thyrotoxicosis with diffuse goiter without thyrotoxic crisis or storm: Secondary | ICD-10-CM | POA: Diagnosis not present

## 2023-02-13 DIAGNOSIS — E0500 Thyrotoxicosis with diffuse goiter without thyrotoxic crisis or storm: Secondary | ICD-10-CM | POA: Diagnosis not present

## 2023-02-13 DIAGNOSIS — I10 Essential (primary) hypertension: Secondary | ICD-10-CM | POA: Diagnosis not present

## 2023-03-30 ENCOUNTER — Other Ambulatory Visit: Payer: Self-pay

## 2023-03-30 MED ORDER — VALSARTAN-HYDROCHLOROTHIAZIDE 320-12.5 MG PO TABS: 0.5000 | ORAL_TABLET | Freq: Every day | ORAL | 2 refills | Status: DC

## 2023-05-25 ENCOUNTER — Ambulatory Visit: Payer: BC Managed Care – PPO | Attending: Cardiology | Admitting: Cardiology

## 2023-05-25 ENCOUNTER — Encounter: Payer: Self-pay | Admitting: Cardiology

## 2023-05-25 VITALS — BP 114/58 | HR 60 | Resp 16 | Ht 74.0 in | Wt 228.0 lb

## 2023-05-25 DIAGNOSIS — Z006 Encounter for examination for normal comparison and control in clinical research program: Secondary | ICD-10-CM

## 2023-05-25 DIAGNOSIS — E7800 Pure hypercholesterolemia, unspecified: Secondary | ICD-10-CM

## 2023-05-25 DIAGNOSIS — I10 Essential (primary) hypertension: Secondary | ICD-10-CM | POA: Diagnosis not present

## 2023-05-25 DIAGNOSIS — I25118 Atherosclerotic heart disease of native coronary artery with other forms of angina pectoris: Secondary | ICD-10-CM | POA: Diagnosis not present

## 2023-05-25 NOTE — Progress Notes (Signed)
 Cardiology Office Note:  .   Date:  05/25/2023  ID:  ABDOULIE Parker, DOB 1955/09/02, MRN [id number] PCP: Lytle Michaels, PA-C  [facility] Providers Cardiologist:  Yates Decamp, MD   History of Present Illness: .   Alejandro Parker is a 68 y.o. male with CAD and proximal LAD stent in 2012, hypertension, hyperglycemia and hyperlipidemia, ongoing tobacco use disorder, Graves' disease, patient is following closely with Dr. Shawnee Knapp.  He is enrolled in clinical trial ACCLAIM-Lp(a)-assess the effect of Lipodisiran SQ yearly on the reduction of Mace in patients with LPA >175 and established ASCVD.    Discussed the use of AI scribe software for clinical note transcription with the patient, who gave verbal consent to proceed.  History of Present Illness   Anothony Parker, a patient with a history of low blood pressure, presents for a routine check-up. He reports feeling cold, which he attributes to his low blood pressure. However, he is otherwise in good health and is considering taking on additional work. He has made some progress in reducing his smoking habit. He is currently participating in a clinical trial, the details of which are not specified in the conversation. He had a recent visit to the emergency room due to shoulder pain, which he initially thought was heart-related. However, the tests conducted during the visit did not indicate any heart-related issues. His blood test results are generally normal, with a slightly low potassium level.      Labs    Lab Results  Component Value Date   NA 142 01/19/2022   K 4.1 01/19/2022   CO2 22 01/19/2022   GLUCOSE 81 01/19/2022   BUN 17 01/19/2022   CREATININE 1.03 01/19/2022   CALCIUM 10.2 01/19/2022   EGFR 80 01/19/2022   GFRNONAA >60 09/27/2016      Latest Ref Rng & Units 01/19/2022    1:11 PM 09/27/2016    1:28 PM 11/12/2012    1:41 PM  BMP  Glucose 70 - 99 mg/dL 81  956  93   BUN 8 - 27 mg/dL 17  16  18    Creatinine 0.76 - 1.27 mg/dL  2.13  0.86  5.78   BUN/Creat Ratio 10 - 24 17     Sodium 134 - 144 mmol/L 142  140  140   Potassium 3.5 - 5.2 mmol/L 4.1  3.7  3.6   Chloride 96 - 106 mmol/L 103  107  106   CO2 20 - 29 mmol/L 22  27  25    Calcium 8.6 - 10.2 mg/dL 46.9  62.9  9.3       Latest Ref Rng & Units 01/19/2022    1:11 PM 09/27/2016    1:28 PM 11/12/2012    1:41 PM  CBC  WBC 3.4 - 10.8 x10E3/uL 5.1  5.1  5.3   Hemoglobin 13.0 - 17.7 g/dL 52.8  8.9  41.3   Hematocrit 37.5 - 51.0 % 41.3  27.7  35.8   Platelets 150 - 450 x10E3/uL 170  131  163     Labs 01/27/2023 on Care Everywhere  Hb 13.3/HCT 39.6, platelets 181.  Sodium 140, potassium 3.2, BUN 11, creatinine 0.90, EGFR 93 mL.  Magnesium 1.8.  TSH normal at 3.910, free T4 normal at 1.07.  Review of Systems  Cardiovascular:  Negative for chest pain, dyspnea on exertion and leg swelling.   Physical Exam:   VS:  BP (!) 114/58 (BP Location: Left Arm, Patient Position: Sitting, Cuff Size: Large)  Pulse 60   Resp 16   Ht 6\' 2"  (1.88 m)   Wt 228 lb (103.4 kg)   SpO2 97%   BMI 29.27 kg/m    Wt Readings from Last 3 Encounters:  05/25/23 228 lb (103.4 kg)  11/23/22 225 lb 3.2 oz (102.2 kg)  08/03/22 237 lb 12.8 oz (107.9 kg)    Physical Exam Neck:     Vascular: No carotid bruit or JVD.  Cardiovascular:     Rate and Rhythm: Normal rate and regular rhythm.     Pulses: Intact distal pulses.     Heart sounds: Normal heart sounds. No murmur heard.    No gallop.  Pulmonary:     Effort: Pulmonary effort is normal.     Breath sounds: Normal breath sounds.  Abdominal:     General: Bowel sounds are normal.     Palpations: Abdomen is soft.  Musculoskeletal:     Right lower leg: No edema.     Left lower leg: No edema.    Studies Reviewed: Marland Kitchen    Coronary angiogram 08/30/2016:  Mid LAD 90% to 0% with 3.5x28 mm Promus (Post dilatation with 3.35mm balloon) placed on 11/16/2010 widely patent.  Mild disease in the LAD, ramus and circumflex coronary artery  and right coronary artery.      EKG:    EKG Interpretation Date/Time:  Thursday May 25 2023 16:13:23 EST Ventricular Rate:  60 PR Interval:  200 QRS Duration:  90 QT Interval:  436 QTC Calculation: 436 R Axis:   23  Text Interpretation: EKG 05/25/2023: Normal sinus rhythm at rate of 60 bpm.  No evidence of ischemia.  Compared to 12/30/2021, marked sinus bradycardia at rate of 48 bpm. Confirmed by Delrae Rend [phone]) on 05/25/2023 4:21:00 PM    Medications and allergies    Allergies  Allergen Reactions   Aleve [Naproxen] Hives   Crestor [Rosuvastatin] Itching   Latex Itching   Other Rash    Synthetic rubber     Current Outpatient Medications:    aspirin 81 MG EC tablet, Take 1 tablet by mouth daily., Disp: , Rfl:    atorvastatin (LIPITOR) 80 MG tablet, TAKE 1 TABLET BY MOUTH DAILY AT 6 PM., Disp: 30 tablet, Rfl: 11   ezetimibe (ZETIA) 10 MG tablet, TAKE 1 TABLET BY MOUTH EVERY DAY, Disp: 30 tablet, Rfl: 5   IBUPROFEN EX, Take 1 tablet by mouth as needed., Disp: , Rfl:    isosorbide mononitrate (IMDUR) 60 MG 24 hr tablet, Take 60 mg by mouth daily., Disp: , Rfl:    methimazole (TAPAZOLE) 5 MG tablet, Take 5 mg by mouth 3 (three) times a week., Disp: , Rfl:    NITROSTAT 0.4 MG SL tablet, Place 0.4 mg under the tongue every 5 (five) minutes as needed for chest pain. As directed if needed, Disp: , Rfl:    NON FORMULARY, Take 1 tablet by mouth daily. OTC immune support - [place], Disp: , Rfl:    omeprazole (PRILOSEC) 40 MG capsule, Take 40 mg by mouth daily., Disp: , Rfl:    spironolactone (ALDACTONE) 25 MG tablet, TAKE 1 TABLET BY MOUTH EVERY DAY IN THE MORNING, Disp: 90 tablet, Rfl: 3   tamsulosin (FLOMAX) 0.4 MG CAPS capsule, Take 1 tablet by mouth daily., Disp: , Rfl:    valsartan-hydrochlorothiazide (DIOVAN-HCT) 320-12.5 MG tablet, Take 0.5 tablets by mouth daily., Disp: 45 tablet, Rfl: 2   ASSESSMENT AND PLAN: .      ICD-10-CM   1.  Coronary artery disease of  native artery of native heart with stable angina pectoris (HCC)  I25.118 EKG 12-Lead    Basic Metabolic Panel (BMET)    Lipid Profile    2. Essential hypertension, benign  I10 Basic Metabolic Panel (BMET)    Lipid Profile    3. Pure hypercholesterolemia  E78.00 Basic Metabolic Panel (BMET)    Lipid Profile    4. Enrolled in clinical trial of drug: ACCLAIM-Lp(a)-effect of Lipodisiran SQ yearly on the reduction of Mace in patients with LPA >175 & ASCVD 10/03/2022  Z00.6 Basic Metabolic Panel (BMET)    Lipid Profile      1. Coronary artery disease of native artery of native heart with stable angina pectoris [facility]) Patient remains asymptomatic without recurrence of angina pectoris.  Continue aspirin 81 g daily, Lipitor 80 mg daily, Zetia 10 mg daily.  2. Essential hypertension, benign Continue valsartan HCT 320/12.5 mg daily 1/2 tablet, Aldactone 25 mg daily and Imdur 60 mg daily.  Previously when we stopped the medications, patient had started to develop chest discomfort and not feeling well.  As he remains completely asymptomatic I recommend that we continue present medical therapy.  3. Pure hypercholesterolemia Check lipid profile.  Patient is enrolled in clinical trials for Lp(a), Lp(a) should not be checked.  4. Enrolled in clinical trial of drug: ACCLAIM-Lp(a)-effect of Lipodisiran SQ yearly on the reduction of Mace in patients with LPA >175 & ASCVD 10/03/2022 No side effects from the medications.  General Health Maintenance Overall health is stable with no significant new concerns. Continues to work and manage daily activities. Noted improvement in smoking cessation efforts. Encourage continued efforts to quit smoking and schedule a follow-up appointment in six months.           Signed,  Yates Decamp, MD, [facility] 05/25/2023, 5:08 PM [facility] [address] [place], [place] [address] Phone: [phone]. Fax:  [phone]

## 2023-05-25 NOTE — Patient Instructions (Addendum)
 Medication Instructions:  Your physician recommends that you continue on your current medications as directed. Please refer to the Current Medication list given to you today.  *If you need a refill on your cardiac medications before your next appointment, please call your pharmacy*   Lab Work: Have lab work checked at [facility] on the first floor  today--BMP and Lipids If you have labs (blood work) drawn today and your tests are completely normal, you will receive your results only by: MyChart Message (if you have MyChart) OR A paper copy in the mail If you have any lab test that is abnormal or we need to change your treatment, we will call you to review the results.   Testing/Procedures: none   Follow-Up: At [facility], you and your health needs are our priority.  As part of our continuing mission to provide you with exceptional heart care, we have created designated Provider Care Teams.  These Care Teams include your primary Cardiologist (physician) and Advanced Practice Providers (APPs -  Physician Assistants and Nurse Practitioners) who all work together to provide you with the care you need, when you need it.  We recommend signing up for the patient portal called "MyChart".  Sign up information is provided on this After Visit Summary.  MyChart is used to connect with patients for Virtual Visits (Telemedicine).  Patients are able to view lab/test results, encounter notes, upcoming appointments, etc.  Non-urgent messages can be sent to your provider as well.   To learn more about what you can do with MyChart, go to [email or web address].    Your next appointment:   6 month(s)  Provider:   Yates Decamp, MD     Other Instructions

## 2023-05-26 ENCOUNTER — Telehealth: Payer: Self-pay | Admitting: Cardiology

## 2023-05-26 DIAGNOSIS — N522 Drug-induced erectile dysfunction: Secondary | ICD-10-CM

## 2023-05-26 NOTE — Telephone Encounter (Signed)
 New message  Patient came to our office today asking Dr Jacinto Halim to call in a presc for viagra,  He said he has done this in the past.  Patient said his old prescription is for 50mg  but he want a new prescription for 100mg .  If ok, please call [facility] in to [facility] [place].

## 2023-05-27 MED ORDER — SILDENAFIL CITRATE 100 MG PO TABS
100.0000 mg | ORAL_TABLET | Freq: Every day | ORAL | 1 refills | Status: DC | PRN
Start: 2023-05-27 — End: 2023-09-23

## 2023-05-27 NOTE — Telephone Encounter (Signed)
 ICD-10-CM   1. Drug-induced erectile dysfunction  N52.2 sildenafil (VIAGRA) 100 MG tablet     Meds ordered this encounter  Medications   sildenafil (VIAGRA) 100 MG tablet    Sig: Take 1 tablet (100 mg total) by mouth daily as needed for erectile dysfunction.    Dispense:  30 tablet    Refill:  1   Medications Discontinued During This Encounter  Medication Reason   isosorbide mononitrate (IMDUR) 60 MG 24 hr tablet No longer needed (for PRN medications)    Yates Decamp, MD, [facility] 05/27/2023, 6:19 PM [facility] [address] [place], [place] [address] Phone: [phone]. Fax:  [phone]

## 2023-05-27 NOTE — Telephone Encounter (Signed)
 I sent the Rx. Please let patient know to stop Imdur 60 mg daily.

## 2023-05-29 NOTE — Telephone Encounter (Addendum)
 Patient notified.  He has SL NTG on his med list and is aware he  cannot take that within 24 hours of taking Viagra

## 2023-05-30 LAB — BASIC METABOLIC PANEL
BUN/Creatinine Ratio: 16 (ref 10–24)
BUN: 17 mg/dL (ref 8–27)
CO2: 21 mmol/L (ref 20–29)
Calcium: 10 mg/dL (ref 8.6–10.2)
Chloride: 107 mmol/L — ABNORMAL HIGH (ref 96–106)
Creatinine, Ser: 1.08 mg/dL (ref 0.76–1.27)
Glucose: 82 mg/dL (ref 70–99)
Potassium: 3.8 mmol/L (ref 3.5–5.2)
Sodium: 143 mmol/L (ref 134–144)
eGFR: 75 mL/min/{1.73_m2} (ref >59)

## 2023-05-30 LAB — LIPID PANEL
Chol/HDL Ratio: 3 ratio (ref 0.0–5.0)
Cholesterol, Total: 154 mg/dL (ref 100–199)
HDL: 52 mg/dL (ref >39)
LDL Chol Calc (NIH): 85 mg/dL (ref 0–99)
Triglycerides: 90 mg/dL (ref 0–149)
VLDL Cholesterol Cal: 17 mg/dL (ref 5–40)

## 2023-05-30 NOTE — Progress Notes (Signed)
 LDL is still high. He was out of Zetia. Will recheck next visit again, to watch out for red meats, fried food. Otherwise normal kidney function.

## 2023-09-21 ENCOUNTER — Other Ambulatory Visit: Payer: Self-pay | Admitting: Cardiology

## 2023-09-21 DIAGNOSIS — N522 Drug-induced erectile dysfunction: Secondary | ICD-10-CM

## 2023-09-26 ENCOUNTER — Other Ambulatory Visit: Payer: Self-pay | Admitting: Cardiology

## 2023-12-02 ENCOUNTER — Other Ambulatory Visit: Payer: Self-pay | Admitting: Cardiology

## 2023-12-02 DIAGNOSIS — N522 Drug-induced erectile dysfunction: Secondary | ICD-10-CM

## 2024-03-06 ENCOUNTER — Other Ambulatory Visit: Payer: Self-pay | Admitting: Cardiology

## 2024-03-06 DIAGNOSIS — N522 Drug-induced erectile dysfunction: Secondary | ICD-10-CM

## 2024-03-08 ENCOUNTER — Other Ambulatory Visit: Payer: Self-pay | Admitting: Cardiology

## 2024-03-08 DIAGNOSIS — N522 Drug-induced erectile dysfunction: Secondary | ICD-10-CM

## 2024-03-22 ENCOUNTER — Other Ambulatory Visit: Payer: Self-pay | Admitting: Cardiology

## 2024-03-26 ENCOUNTER — Telehealth: Payer: Self-pay | Admitting: Cardiology

## 2024-03-26 DIAGNOSIS — N522 Drug-induced erectile dysfunction: Secondary | ICD-10-CM

## 2024-03-26 NOTE — Telephone Encounter (Signed)
 Pt is requesting a refill on medication sildenafil. Would Dr. Ganji like to refill this medication? Please address

## 2024-03-26 NOTE — Telephone Encounter (Signed)
" °*  STAT* If patient is at the pharmacy, call can be transferred to refill team.   1. Which medications need to be refilled? (please list name of each medication and dose if known) sildenafil  (VIAGRA ) 100 MG tablet    2. Would you like to learn more about the convenience, safety, & potential cost savings by using the [facility]? No    3. Are you open to using the [facility] (Type [facility]. No    4. Which pharmacy/location (including street and city if local pharmacy) is medication to be sent to?[id number] [place], [place] - [address]    5. Do they need a 30 day or 90 day supply? 90 day s   "

## 2024-03-28 MED ORDER — SILDENAFIL CITRATE 100 MG PO TABS
100.0000 mg | ORAL_TABLET | Freq: Every day | ORAL | 3 refills | Status: AC | PRN
  Filled 2024-03-28: qty 30, 30d supply, fill #0

## 2024-03-28 NOTE — Telephone Encounter (Signed)
"    ICD-10-CM   1. Drug-induced erectile dysfunction  N52.2 sildenafil  (VIAGRA ) 100 MG tablet     Meds ordered this encounter  Medications   sildenafil  (VIAGRA ) 100 MG tablet    Sig: Take 1 tablet (100 mg total) by mouth daily as needed for erectile dysfunction.    Dispense:  30 tablet    Refill:  3    "

## 2024-03-29 ENCOUNTER — Other Ambulatory Visit (HOSPITAL_COMMUNITY): Payer: Self-pay

## 2024-04-09 ENCOUNTER — Other Ambulatory Visit (HOSPITAL_COMMUNITY): Payer: Self-pay

## 2024-04-20 ENCOUNTER — Other Ambulatory Visit: Payer: Self-pay | Admitting: Cardiology
